# Patient Record
Sex: Male | Born: 1960 | Race: White | Hispanic: No | Marital: Married | Smoking: Current every day smoker
Health system: Southern US, Community
[De-identification: ages and names within clinical notes are randomized; demographics above are authoritative.]

## PROBLEM LIST (undated history)

## (undated) DIAGNOSIS — R0600 Dyspnea, unspecified: Secondary | ICD-10-CM

## (undated) DIAGNOSIS — G4733 Obstructive sleep apnea (adult) (pediatric): Secondary | ICD-10-CM

## (undated) DIAGNOSIS — J449 Chronic obstructive pulmonary disease, unspecified: Secondary | ICD-10-CM

## (undated) HISTORY — DX: Chronic obstructive pulmonary disease, unspecified: J44.9

## (undated) HISTORY — PX: CARDIAC CATHETERIZATION: SHX172

## (undated) HISTORY — DX: Obstructive sleep apnea (adult) (pediatric): G47.33

---

## 2009-09-15 ENCOUNTER — Ambulatory Visit (HOSPITAL_BASED_OUTPATIENT_CLINIC_OR_DEPARTMENT_OTHER): Admission: RE | Admit: 2009-09-15 | Discharge: 2009-09-15 | Payer: Self-pay | Admitting: Orthopedic Surgery

## 2012-01-14 DIAGNOSIS — G2581 Restless legs syndrome: Secondary | ICD-10-CM | POA: Diagnosis not present

## 2012-01-14 DIAGNOSIS — G4710 Hypersomnia, unspecified: Secondary | ICD-10-CM | POA: Diagnosis not present

## 2012-01-14 DIAGNOSIS — J310 Chronic rhinitis: Secondary | ICD-10-CM | POA: Diagnosis not present

## 2012-01-14 DIAGNOSIS — J449 Chronic obstructive pulmonary disease, unspecified: Secondary | ICD-10-CM | POA: Diagnosis not present

## 2012-01-14 DIAGNOSIS — G4730 Sleep apnea, unspecified: Secondary | ICD-10-CM | POA: Diagnosis not present

## 2012-07-18 DIAGNOSIS — Z23 Encounter for immunization: Secondary | ICD-10-CM | POA: Diagnosis not present

## 2012-10-15 DIAGNOSIS — J449 Chronic obstructive pulmonary disease, unspecified: Secondary | ICD-10-CM | POA: Diagnosis not present

## 2012-10-15 DIAGNOSIS — G4710 Hypersomnia, unspecified: Secondary | ICD-10-CM | POA: Diagnosis not present

## 2012-10-15 DIAGNOSIS — G2581 Restless legs syndrome: Secondary | ICD-10-CM | POA: Diagnosis not present

## 2012-10-15 DIAGNOSIS — J310 Chronic rhinitis: Secondary | ICD-10-CM | POA: Diagnosis not present

## 2012-10-15 DIAGNOSIS — G4730 Sleep apnea, unspecified: Secondary | ICD-10-CM | POA: Diagnosis not present

## 2012-10-30 DIAGNOSIS — R0602 Shortness of breath: Secondary | ICD-10-CM | POA: Diagnosis not present

## 2012-10-30 DIAGNOSIS — I10 Essential (primary) hypertension: Secondary | ICD-10-CM | POA: Diagnosis not present

## 2012-10-30 DIAGNOSIS — F17200 Nicotine dependence, unspecified, uncomplicated: Secondary | ICD-10-CM | POA: Diagnosis not present

## 2012-10-30 DIAGNOSIS — J441 Chronic obstructive pulmonary disease with (acute) exacerbation: Secondary | ICD-10-CM | POA: Diagnosis not present

## 2013-06-15 DIAGNOSIS — Z23 Encounter for immunization: Secondary | ICD-10-CM | POA: Diagnosis not present

## 2013-08-27 DIAGNOSIS — F17200 Nicotine dependence, unspecified, uncomplicated: Secondary | ICD-10-CM | POA: Diagnosis not present

## 2013-08-27 DIAGNOSIS — Z79899 Other long term (current) drug therapy: Secondary | ICD-10-CM | POA: Diagnosis not present

## 2013-08-27 DIAGNOSIS — J441 Chronic obstructive pulmonary disease with (acute) exacerbation: Secondary | ICD-10-CM | POA: Diagnosis not present

## 2013-08-27 DIAGNOSIS — E785 Hyperlipidemia, unspecified: Secondary | ICD-10-CM | POA: Diagnosis not present

## 2013-08-27 DIAGNOSIS — E669 Obesity, unspecified: Secondary | ICD-10-CM | POA: Diagnosis not present

## 2013-08-27 DIAGNOSIS — R0789 Other chest pain: Secondary | ICD-10-CM | POA: Diagnosis not present

## 2013-08-27 DIAGNOSIS — R5381 Other malaise: Secondary | ICD-10-CM | POA: Diagnosis not present

## 2013-08-27 DIAGNOSIS — J180 Bronchopneumonia, unspecified organism: Secondary | ICD-10-CM | POA: Diagnosis not present

## 2013-08-27 DIAGNOSIS — R079 Chest pain, unspecified: Secondary | ICD-10-CM | POA: Diagnosis not present

## 2013-08-27 DIAGNOSIS — I10 Essential (primary) hypertension: Secondary | ICD-10-CM | POA: Diagnosis not present

## 2013-08-27 DIAGNOSIS — I498 Other specified cardiac arrhythmias: Secondary | ICD-10-CM | POA: Diagnosis not present

## 2013-08-27 DIAGNOSIS — J449 Chronic obstructive pulmonary disease, unspecified: Secondary | ICD-10-CM | POA: Diagnosis not present

## 2013-08-27 DIAGNOSIS — J440 Chronic obstructive pulmonary disease with acute lower respiratory infection: Secondary | ICD-10-CM | POA: Diagnosis not present

## 2013-08-27 DIAGNOSIS — R0602 Shortness of breath: Secondary | ICD-10-CM | POA: Diagnosis not present

## 2013-09-17 DIAGNOSIS — J440 Chronic obstructive pulmonary disease with acute lower respiratory infection: Secondary | ICD-10-CM | POA: Diagnosis not present

## 2013-09-17 DIAGNOSIS — I10 Essential (primary) hypertension: Secondary | ICD-10-CM | POA: Diagnosis not present

## 2013-10-15 DIAGNOSIS — J441 Chronic obstructive pulmonary disease with (acute) exacerbation: Secondary | ICD-10-CM | POA: Diagnosis not present

## 2013-10-21 ENCOUNTER — Institutional Professional Consult (permissible substitution): Payer: Self-pay | Admitting: Internal Medicine

## 2013-11-10 ENCOUNTER — Ambulatory Visit (INDEPENDENT_AMBULATORY_CARE_PROVIDER_SITE_OTHER)
Admission: RE | Admit: 2013-11-10 | Discharge: 2013-11-10 | Disposition: A | Discharge status: Home / Self Care | Payer: Medicare Other | Source: Ambulatory Visit | Attending: Internal Medicine | Admitting: Internal Medicine

## 2013-11-10 ENCOUNTER — Encounter: Payer: Self-pay | Admitting: Internal Medicine

## 2013-11-10 ENCOUNTER — Ambulatory Visit (INDEPENDENT_AMBULATORY_CARE_PROVIDER_SITE_OTHER): Payer: Medicare Other | Admitting: Internal Medicine

## 2013-11-10 VITALS — BP 140/80 | HR 104 | Temp 97.9°F | Ht 68.0 in | Wt 281.0 lb

## 2013-11-10 DIAGNOSIS — J4489 Other specified chronic obstructive pulmonary disease: Secondary | ICD-10-CM

## 2013-11-10 DIAGNOSIS — F17200 Nicotine dependence, unspecified, uncomplicated: Secondary | ICD-10-CM

## 2013-11-10 DIAGNOSIS — J449 Chronic obstructive pulmonary disease, unspecified: Secondary | ICD-10-CM

## 2013-11-10 MED ORDER — PREDNISONE 10 MG PO TABS: ORAL_TABLET | ORAL | Status: DC

## 2013-11-10 MED ORDER — FAMOTIDINE 20 MG PO TABS: ORAL_TABLET | ORAL | Status: DC

## 2013-11-10 MED ORDER — PANTOPRAZOLE SODIUM 40 MG PO TBEC: 40.0000 mg | DELAYED_RELEASE_TABLET | Freq: Every day | ORAL | Status: DC

## 2013-11-10 MED ORDER — ALBUTEROL SULFATE HFA 108 (90 BASE) MCG/ACT IN AERS: 2.0000 | INHALATION_SPRAY | RESPIRATORY_TRACT | Status: AC | PRN

## 2013-11-10 NOTE — Assessment & Plan Note (Signed)
-   11/10/2013   Walked RA x 2 laps @ 185 stopped due to sob no desat  DDX of  difficult airways managment all start with A and  include Adherence, Ace Inhibitors, Acid Reflux, Active Sinus Disease, Alpha 1 Antitripsin deficiency, Anxiety masquerading as Airways dz,  ABPA,  allergy(esp in young), Aspiration (esp in elderly), Adverse effects of DPI,  Active smokers, plus two Bs  = Bronchiectasis and Beta blocker use..and one C= CHF  Active smoking is greatest concern > see smoking a/p  ? Acid (or non-acid) GERD > always difficult to exclude as up to 75% of pts in some series report no assoc GI/ Heartburn symptoms> rec max (24h)  acid suppression and diet restrictions/ reviewed and instructions given in writing.   ? Anxiety/ obesity/ deconditioning all playing a role as well > note he walked a lot more than "across the room" for us today     Each maintenance medication was reviewed in detail including most importantly the difference between maintenance and as needed and under what circumstances the prns are to be used.  Please see instructions for details which were reviewed in writing and the patient given a copy.

## 2013-11-10 NOTE — Patient Instructions (Addendum)
Prednisone 10 mg take  4 each am x 2 days,   2 each am x 2 days,  1 each am x 2 days and stop   Pantoprazole (protonix) 40 mg   Take 30-60 min before first meal of the day and Pepcid 20 mg one bedtime until return to office - this is the best way to tell whether stomach acid is contributing to your problem.    GERD (REFLUX)  is an extremely common cause of respiratory symptoms, many times with no significant heartburn at all.    It can be treated with medication, but also with lifestyle changes including avoidance of late meals, excessive alcohol, smoking cessation, and avoid fatty foods, chocolate, peppermint, colas, red wine, and acidic juices such as orange juice.  NO MINT OR MENTHOL PRODUCTS SO NO COUGH DROPS  USE SUGARLESS CANDY INSTEAD (jolley ranchers or Stover's)  NO OIL BASED VITAMINS - use powdered substitutes.  Only use your albuterol(proair) as a rescue medication to be used if you can't catch your breath by resting or doing a relaxed purse lip breathing pattern.  - The less you use it, the better it will work when you need it. - Ok to use up to 2 puffs  every 4 hours if you must but call for immediate appointment if use goes up over your usual need - Don't leave home without it !!  (think of it like the spare tire for your car)   Please remember to go to the x-ray department downstairs for your tests - we will call you with the results when they are available.     The key is to stop smoking completely before smoking completely stops you- this is the most important aspect of your care   Please schedule a follow up office visit in 4 weeks, sooner if needed

## 2013-11-10 NOTE — Assessment & Plan Note (Signed)

## 2013-11-10 NOTE — Progress Notes (Signed)
Quick Note:  LMTCB ______ 

## 2013-11-10 NOTE — Progress Notes (Signed)
Subjective:    Patient ID: Henry LoseJohn R Watts, male    DOB: 07/12/1961    MRN: [id number]  HPI  89 yowm active smoking dx 2003 dx COPD HP then follow in [place] by Chodri since then  referred 11/10/2013 by Dr Marina GoodellPerry to pulmonary clinic for eval of progressive doe   11/10/2013 1st [facility] office visit/ Henry Watts cc progressive x 10 y doe now x room to room with 50 lb wt gain since dx of copd 10 y prior to OV  some better on duoneb/budesonide. Assoc with min prod (white mucus)  cough worse at hs and typically sleeping at 45 degrees - wearing 02 just at hs 2.5lpm  and can't use cpap due to smothering  Also cp x 3 years, present daily x hours at a time, no relation to activity or meals, neg cardiac w/u x 3 including cath x 2, midline to bilateral parasternal not pleuritic but ? Some better p neb rx, no better with rx for gerd to date  No obvious patterns in day to day or daytime variabilty or assoc  subjective wheeze overt sinus  symptoms. No unusual exp hx or h/o childhood pna/ asthma or knowledge of premature birth.    Also denies any obvious fluctuation of symptoms with weather or environmental changes or other aggravating or alleviating factors except as outlined above   Current Medications, Allergies, Complete Past Medical History, Past Surgical History, Family History, and Social History were reviewed in [facility] electronic medical record.  ROS  The following are not active complaints unless bolded sore throat, dysphagia, dental problems, itching, sneezing,  nasal congestion or excess/ purulent secretions, ear ache,   fever, chills, sweats, unintended wt loss, pleuritic or exertional cp, hemoptysis,  orthopnea pnd or leg swelling, presyncope, palpitations, heartburn, abdominal pain, anorexia, nausea, vomiting, diarrhea  or change in bowel or urinary habits, change in stools or urine, dysuria,hematuria,  rash, arthralgias, visual complaints, headache, numbness weakness or ataxia or problems  with walking or coordination,  change in mood/affect or memory.         Review of Systems  Constitutional: Negative for fever, chills, activity change, appetite change and unexpected weight change.  HENT: Positive for congestion and trouble swallowing. Negative for dental problem, postnasal drip, rhinorrhea, sneezing, sore throat and voice change.   Eyes: Negative for visual disturbance.  Respiratory: Positive for cough and shortness of breath. Negative for choking.   Cardiovascular: Positive for chest pain. Negative for leg swelling.  Gastrointestinal: Negative for nausea, vomiting and abdominal pain.  Genitourinary: Negative for difficulty urinating.       Heartburn Indigestion  Musculoskeletal: Positive for arthralgias.  Skin: Negative for rash.  Psychiatric/Behavioral: Negative for behavioral problems and confusion.       Objective:   Physical Exam Wt Readings from Last 3 Encounters:  11/10/13 281 lb (127.461 kg)      HEENT mild turbinate edema.  Oropharynx no thrush or excess pnd or cobblestoning.  No JVD or cervical adenopathy. Mild accessory muscle hypertrophy. Trachea midline, nl thryroid. Chest was hyperinflated by percussion with diminished breath sounds and moderate increased exp time without wheeze. Hoover sign positive at mid inspiration. Regular rate and rhythm without murmur gallop or rub or increase P2 or edema.  Abd: no hsm, nl excursion. Ext warm without cyanosis or clubbing.   CXR  11/10/2013 :   1. No acute cardiopulmonary disease. 2. COPD and changes from healed granulomatous disease. Mild lung base scarring.  Assessment & Plan:

## 2013-11-11 ENCOUNTER — Telehealth: Payer: Self-pay | Admitting: Internal Medicine

## 2013-11-11 NOTE — Telephone Encounter (Signed)
Result Note     Call pt: Reviewed cxr and no acute change so no change in recommendations made at ov  ---  I spoke with patient about results and he verbalized understanding and had no questions 

## 2013-12-02 DIAGNOSIS — J441 Chronic obstructive pulmonary disease with (acute) exacerbation: Secondary | ICD-10-CM | POA: Diagnosis not present

## 2013-12-09 DIAGNOSIS — J441 Chronic obstructive pulmonary disease with (acute) exacerbation: Secondary | ICD-10-CM | POA: Diagnosis not present

## 2013-12-15 ENCOUNTER — Encounter: Payer: Self-pay | Admitting: Internal Medicine

## 2013-12-15 ENCOUNTER — Ambulatory Visit (INDEPENDENT_AMBULATORY_CARE_PROVIDER_SITE_OTHER): Payer: Medicare Other | Admitting: Internal Medicine

## 2013-12-15 VITALS — BP 130/84 | HR 110 | Temp 97.9°F | Ht 68.0 in | Wt 280.0 lb

## 2013-12-15 DIAGNOSIS — J449 Chronic obstructive pulmonary disease, unspecified: Secondary | ICD-10-CM | POA: Diagnosis not present

## 2013-12-15 DIAGNOSIS — F17200 Nicotine dependence, unspecified, uncomplicated: Secondary | ICD-10-CM

## 2013-12-15 DIAGNOSIS — J4489 Other specified chronic obstructive pulmonary disease: Secondary | ICD-10-CM

## 2013-12-15 LAB — PULMONARY FUNCTION TEST
DL/VA % pred: 90 %
DL/VA: 4.08 ml/min/mmHg/L
DLCO UNC % PRED: 78 %
DLCO UNC: 23.21 ml/min/mmHg
FEF 25-75 POST: 0.61 L/s
FEF 25-75 Pre: 0.51 L/sec
FEF2575-%Change-Post: 20 %
FEF2575-%Pred-Post: 19 %
FEF2575-%Pred-Pre: 15 %
FEV1-%Change-Post: 5 %
FEV1-%Pred-Post: 33 %
FEV1-%Pred-Pre: 32 %
FEV1-Post: 1.22 L
FEV1-Pre: 1.16 L
FEV1FVC-%Change-Post: 0 %
FEV1FVC-%Pred-Pre: 55 %
FEV6-%Change-Post: 5 %
FEV6-%Pred-Post: 60 %
FEV6-%Pred-Pre: 57 %
FEV6-POST: 2.73 L
FEV6-PRE: 2.58 L
FEV6FVC-%CHANGE-POST: 0 %
FEV6FVC-%PRED-POST: 100 %
FEV6FVC-%PRED-PRE: 100 %
FVC-%CHANGE-POST: 5 %
FVC-%PRED-POST: 60 %
FVC-%PRED-PRE: 57 %
FVC-Post: 2.84 L
FVC-Pre: 2.69 L
PRE FEV1/FVC RATIO: 43 %
Post FEV1/FVC ratio: 43 %
Post FEV6/FVC ratio: 96 %
Pre FEV6/FVC Ratio: 96 %
RV % pred: 192 %
RV: 3.77 L
TLC % PRED: 104 %
TLC: 6.88 L

## 2013-12-15 MED ORDER — UMECLIDINIUM-VILANTEROL 62.5-25 MCG/INH IN AEPB: 2.0000 | INHALATION_SPRAY | Freq: Once | RESPIRATORY_TRACT | Status: DC

## 2013-12-15 NOTE — Progress Notes (Signed)
PFT done today. 

## 2013-12-15 NOTE — Progress Notes (Signed)
Subjective:    Patient ID: Henry Watts, male    DOB: May 12, 1961    MRN: [id number]  Brief patient profile:  22 yowm active smoking dx 2003 dx COPD HP then follow in [place] by Chodri since then  referred 11/10/2013 by Dr Marina Goodell to pulmonary clinic for eval of progressive doe    History of Present Illness  11/10/2013 1st [facility] office visit/ Wert cc progressive x 10 y doe now x room to room with 50 lb wt gain since dx of copd 10 y prior to OV  some better on duoneb/budesonide. Assoc with min prod (white mucus)  cough worse at hs and typically sleeping at 45 degrees - wearing 02 just at hs 2.5lpm  and can't use cpap due to smothering Also cp x 3 years, present daily x hours at a time, no relation to activity or meals, neg cardiac w/u x 3 including cath x 2, midline to bilateral parasternal not pleuritic but ? Some better p neb rx, no better with rx for gerd to date rec Prednisone 10 mg take  4 each am x 2 days,   2 each am x 2 days,  1 each am x 2 days and stop  Pantoprazole (protonix) 40 mg   Take 30-60 min before first meal of the day and Pepcid 20 mg one bedtime until return to office - this is the best way to tell whether stomach acid is contributing to your problem.   GERD diet   12/15/2013 f/u ov/Wert re: GOLD III COPD/ still smoking/ no change p above rx Chief Complaint  Patient presents with  . Followup with PFT's    Pt states that his symptoms are unchagned since the last visit. Still smoking. Using proair approx 5 times daily and duoneb 8 x per day.    Congested cough in am with thick mucus   No obvious patterns in day to day or daytime variabilty or assoc  subjective wheeze overt sinus  symptoms. No unusual exp hx or h/o childhood pna/ asthma or knowledge of premature birth.    Also denies any obvious fluctuation of symptoms with weather or environmental changes or other aggravating or alleviating factors except as outlined above   Current Medications, Allergies,  Complete Past Medical History, Past Surgical History, Family History, and Social History were reviewed in [facility] record.  ROS  The following are not active complaints unless bolded sore throat, dysphagia, dental problems, itching, sneezing,  nasal congestion or excess/ purulent secretions, ear ache,   fever, chills, sweats, unintended wt loss, pleuritic or exertional cp, hemoptysis,  orthopnea pnd or leg swelling, presyncope, palpitations, heartburn, abdominal pain, anorexia, nausea, vomiting, diarrhea  or change in bowel or urinary habits, change in stools or urine, dysuria,hematuria,  rash, arthralgias, visual complaints, headache, numbness weakness or ataxia or problems with walking or coordination,  change in mood/affect or memory.               Objective:   Physical Exam  Wt Readings from Last 3 Encounters:  12/15/13 280 lb (127.007 kg)  11/10/13 281 lb (127.461 kg)         HEENT mild turbinate edema.  Oropharynx no thrush or excess pnd or cobblestoning.  No JVD or cervical adenopathy. Mild accessory muscle hypertrophy. Trachea midline, nl thryroid. Chest was hyperinflated by percussion with diminished breath sounds and moderate increased exp time without wheeze. Hoover sign positive at mid inspiration. Regular rate and rhythm without murmur  gallop or rub or increase P2 or edema.  Abd: no hsm, nl excursion. Ext warm without cyanosis or clubbing.   CXR  11/10/2013 :   1. No acute cardiopulmonary disease. 2. COPD and changes from healed granulomatous disease. Mild lung base scarring.       Assessment & Plan:

## 2013-12-15 NOTE — Patient Instructions (Signed)
Try anoro 2puffs/ one click each am  The key is to stop smoking completely before smoking completely stops you!   Only use your albuterol (proair) as a rescue medication to be used if you can't catch your breath by resting or doing a relaxed purse lip breathing pattern.  - The less you use it, the better it will work when you need it. - Ok to use up to 2 puffs  every 4 hours if you must but call for immediate appointment if use goes up over your usual need - Don't leave home without it !!  (think of it like the spare tire for your car)   If can't catch your breath at rest, ok to use duoneb if you have to  Please schedule a follow up office visit in 4 weeks, sooner if needed

## 2013-12-17 NOTE — Progress Notes (Signed)
Quick Note:  LMTCB ______ 

## 2013-12-17 NOTE — Progress Notes (Signed)
Quick Note:  Spoke with pt's and notified of results per Dr. Sherene SiresWert. She verbalized understanding and denied any questions. Will inform the pt  ______

## 2013-12-19 NOTE — Assessment & Plan Note (Signed)
>   3 mi  I took an extended  opportunity with this patient to outline the consequences of continued cigarette use  in airway disorders based on all the data we have from the multiple national lung health studies (perfomed over decades at millions of dollars in cost)  indicating that smoking cessation, not choice of inhalers or physicians, is the most important aspect of care.   

## 2013-12-19 NOTE — Assessment & Plan Note (Signed)
-   [facility]  - 11/10/2013   Walked RA x 2 laps  @ 185 stopped due to sob no desat - PFT's 12/15/2013  FEV1 1.16 (32%) 43 and no better p B2 and DLCO 78%   DDX of  difficult airways managment all start with A and  include Adherence, Ace Inhibitors, Acid Reflux, Active Sinus Disease, Alpha 1 Antitripsin deficiency, Anxiety masquerading as Airways dz,  ABPA,  allergy(esp in young), Aspiration (esp in elderly), Adverse effects of DPI,  Active smokers, plus two Bs  = Bronchiectasis and Beta blocker use..and one C= CHF  Adherence is always the initial "prime suspect" and is a multilayered concern that requires a "trust but verify" approach in every patient - starting with knowing how to use medications, especially inhalers, correctly, keeping up with refills and understanding the fundamental difference between maintenance and prns vs those medications only taken for a very short course and then stopped and not refilled.  - The proper method of use, as well as anticipated side effects, of a metered-dose inhaler are discussed and demonstrated to the patient. Improved effectiveness after extensive coaching during this visit to a level of approximately  75% at best and way over using saba/sama  rec trial of anoro one puff each am as a new "plan A" = automatic  Active smoking discussed separately  ? Anxiety > dx of exclusion

## 2014-01-01 ENCOUNTER — Encounter: Payer: Self-pay | Admitting: Internal Medicine

## 2014-01-05 DIAGNOSIS — E7800 Pure hypercholesterolemia, unspecified: Secondary | ICD-10-CM | POA: Diagnosis not present

## 2014-01-05 DIAGNOSIS — R7309 Other abnormal glucose: Secondary | ICD-10-CM | POA: Diagnosis not present

## 2014-01-05 DIAGNOSIS — E1142 Type 2 diabetes mellitus with diabetic polyneuropathy: Secondary | ICD-10-CM | POA: Diagnosis not present

## 2014-01-05 DIAGNOSIS — I10 Essential (primary) hypertension: Secondary | ICD-10-CM | POA: Diagnosis not present

## 2014-01-05 DIAGNOSIS — J4489 Other specified chronic obstructive pulmonary disease: Secondary | ICD-10-CM | POA: Diagnosis not present

## 2014-01-05 DIAGNOSIS — G610 Guillain-Barre syndrome: Secondary | ICD-10-CM | POA: Diagnosis not present

## 2014-01-05 DIAGNOSIS — J449 Chronic obstructive pulmonary disease, unspecified: Secondary | ICD-10-CM | POA: Diagnosis not present

## 2014-01-12 ENCOUNTER — Ambulatory Visit: Payer: Medicare Other | Admitting: Internal Medicine

## 2014-01-13 DIAGNOSIS — IMO0001 Reserved for inherently not codable concepts without codable children: Secondary | ICD-10-CM | POA: Diagnosis not present

## 2014-01-13 DIAGNOSIS — E785 Hyperlipidemia, unspecified: Secondary | ICD-10-CM | POA: Diagnosis not present

## 2014-01-18 ENCOUNTER — Telehealth: Payer: Self-pay | Admitting: Internal Medicine

## 2014-01-18 MED ORDER — UMECLIDINIUM-VILANTEROL 62.5-25 MCG/INH IN AEPB: 2.0000 | INHALATION_SPRAY | Freq: Once | RESPIRATORY_TRACT | Status: DC

## 2014-01-18 NOTE — Telephone Encounter (Signed)
lmomtcb x1 

## 2014-01-18 NOTE — Telephone Encounter (Signed)
Souse aware samples left for pick up. Nothing further needed

## 2014-01-18 NOTE — Telephone Encounter (Signed)
Pt's wife returned call  

## 2014-01-26 ENCOUNTER — Ambulatory Visit: Payer: Medicare Other | Admitting: Internal Medicine

## 2014-02-08 ENCOUNTER — Telehealth: Payer: Self-pay | Admitting: Internal Medicine

## 2014-02-08 MED ORDER — UMECLIDINIUM-VILANTEROL 62.5-25 MCG/INH IN AEPB: 2.0000 | INHALATION_SPRAY | Freq: Once | RESPIRATORY_TRACT | Status: DC

## 2014-02-08 NOTE — Telephone Encounter (Signed)
Called spoke with spouse. Aware will send in RX nothing further needed

## 2014-02-09 ENCOUNTER — Telehealth: Payer: Self-pay | Admitting: Internal Medicine

## 2014-02-09 NOTE — Telephone Encounter (Signed)
PA is needed on Anoro.  Called [employer] [facility] and spoke with Kevin FentonJerome. PA was iniated and completed. Anoro has been approved through 10/07/2014. An authorization was not given. Per Rochel BromeJerome, [employer] doesn't issue authorization number.  Pt's wife is aware that medication has been approved.

## 2014-02-11 ENCOUNTER — Telehealth: Payer: Self-pay | Admitting: Internal Medicine

## 2014-02-11 MED ORDER — UMECLIDINIUM-VILANTEROL 62.5-25 MCG/INH IN AEPB: 2.0000 | INHALATION_SPRAY | Freq: Once | RESPIRATORY_TRACT | Status: AC

## 2014-02-11 NOTE — Telephone Encounter (Signed)
I called and spoke with spouse. She is aware anoro is not generic. 1 sample left for pick up. Nothing further needed

## 2014-03-17 ENCOUNTER — Other Ambulatory Visit: Payer: Self-pay | Admitting: Internal Medicine

## 2014-04-12 DIAGNOSIS — E1142 Type 2 diabetes mellitus with diabetic polyneuropathy: Secondary | ICD-10-CM | POA: Diagnosis not present

## 2014-04-12 DIAGNOSIS — E785 Hyperlipidemia, unspecified: Secondary | ICD-10-CM | POA: Diagnosis not present

## 2014-04-12 DIAGNOSIS — Z6839 Body mass index (BMI) 39.0-39.9, adult: Secondary | ICD-10-CM | POA: Diagnosis not present

## 2014-04-12 DIAGNOSIS — N390 Urinary tract infection, site not specified: Secondary | ICD-10-CM | POA: Diagnosis not present

## 2014-04-12 DIAGNOSIS — E1149 Type 2 diabetes mellitus with other diabetic neurological complication: Secondary | ICD-10-CM | POA: Diagnosis not present

## 2014-04-12 DIAGNOSIS — J449 Chronic obstructive pulmonary disease, unspecified: Secondary | ICD-10-CM | POA: Diagnosis not present

## 2014-04-13 DIAGNOSIS — E119 Type 2 diabetes mellitus without complications: Secondary | ICD-10-CM | POA: Diagnosis not present

## 2014-04-13 DIAGNOSIS — J449 Chronic obstructive pulmonary disease, unspecified: Secondary | ICD-10-CM | POA: Diagnosis not present

## 2014-04-13 DIAGNOSIS — E1142 Type 2 diabetes mellitus with diabetic polyneuropathy: Secondary | ICD-10-CM | POA: Diagnosis not present

## 2014-04-13 DIAGNOSIS — E785 Hyperlipidemia, unspecified: Secondary | ICD-10-CM | POA: Diagnosis not present

## 2014-05-05 ENCOUNTER — Other Ambulatory Visit: Payer: Self-pay | Admitting: Internal Medicine

## 2014-05-12 DIAGNOSIS — F3289 Other specified depressive episodes: Secondary | ICD-10-CM | POA: Diagnosis not present

## 2014-05-12 DIAGNOSIS — F329 Major depressive disorder, single episode, unspecified: Secondary | ICD-10-CM | POA: Diagnosis not present

## 2014-05-12 DIAGNOSIS — Z6838 Body mass index (BMI) 38.0-38.9, adult: Secondary | ICD-10-CM | POA: Diagnosis not present

## 2014-06-11 DIAGNOSIS — E11319 Type 2 diabetes mellitus with unspecified diabetic retinopathy without macular edema: Secondary | ICD-10-CM | POA: Diagnosis not present

## 2014-06-11 DIAGNOSIS — F3289 Other specified depressive episodes: Secondary | ICD-10-CM | POA: Diagnosis not present

## 2014-06-11 DIAGNOSIS — F329 Major depressive disorder, single episode, unspecified: Secondary | ICD-10-CM | POA: Diagnosis not present

## 2014-06-23 DIAGNOSIS — Z23 Encounter for immunization: Secondary | ICD-10-CM | POA: Diagnosis not present

## 2014-07-12 DIAGNOSIS — E1140 Type 2 diabetes mellitus with diabetic neuropathy, unspecified: Secondary | ICD-10-CM | POA: Diagnosis not present

## 2014-07-12 DIAGNOSIS — I10 Essential (primary) hypertension: Secondary | ICD-10-CM | POA: Diagnosis not present

## 2014-07-12 DIAGNOSIS — E1142 Type 2 diabetes mellitus with diabetic polyneuropathy: Secondary | ICD-10-CM | POA: Diagnosis not present

## 2014-07-12 DIAGNOSIS — Z6839 Body mass index (BMI) 39.0-39.9, adult: Secondary | ICD-10-CM | POA: Diagnosis not present

## 2014-07-12 DIAGNOSIS — G8929 Other chronic pain: Secondary | ICD-10-CM | POA: Diagnosis not present

## 2014-07-12 DIAGNOSIS — J449 Chronic obstructive pulmonary disease, unspecified: Secondary | ICD-10-CM | POA: Diagnosis not present

## 2014-08-11 DIAGNOSIS — R0902 Hypoxemia: Secondary | ICD-10-CM | POA: Diagnosis not present

## 2014-08-11 DIAGNOSIS — Z6838 Body mass index (BMI) 38.0-38.9, adult: Secondary | ICD-10-CM | POA: Diagnosis not present

## 2014-08-11 DIAGNOSIS — J441 Chronic obstructive pulmonary disease with (acute) exacerbation: Secondary | ICD-10-CM | POA: Diagnosis not present

## 2014-09-12 DIAGNOSIS — J9811 Atelectasis: Secondary | ICD-10-CM | POA: Diagnosis not present

## 2014-09-12 DIAGNOSIS — E119 Type 2 diabetes mellitus without complications: Secondary | ICD-10-CM | POA: Diagnosis not present

## 2014-09-12 DIAGNOSIS — R069 Unspecified abnormalities of breathing: Secondary | ICD-10-CM | POA: Diagnosis not present

## 2014-09-12 DIAGNOSIS — J441 Chronic obstructive pulmonary disease with (acute) exacerbation: Secondary | ICD-10-CM | POA: Diagnosis not present

## 2014-09-12 DIAGNOSIS — A419 Sepsis, unspecified organism: Secondary | ICD-10-CM | POA: Diagnosis not present

## 2014-09-12 DIAGNOSIS — F17210 Nicotine dependence, cigarettes, uncomplicated: Secondary | ICD-10-CM | POA: Diagnosis not present

## 2014-09-12 DIAGNOSIS — I10 Essential (primary) hypertension: Secondary | ICD-10-CM | POA: Diagnosis not present

## 2014-09-14 DIAGNOSIS — A084 Viral intestinal infection, unspecified: Secondary | ICD-10-CM | POA: Diagnosis not present

## 2014-09-14 DIAGNOSIS — J441 Chronic obstructive pulmonary disease with (acute) exacerbation: Secondary | ICD-10-CM | POA: Diagnosis not present

## 2014-09-14 DIAGNOSIS — Z6839 Body mass index (BMI) 39.0-39.9, adult: Secondary | ICD-10-CM | POA: Diagnosis not present

## 2014-10-12 DIAGNOSIS — Z6839 Body mass index (BMI) 39.0-39.9, adult: Secondary | ICD-10-CM | POA: Diagnosis not present

## 2014-10-12 DIAGNOSIS — I10 Essential (primary) hypertension: Secondary | ICD-10-CM | POA: Diagnosis not present

## 2014-10-12 DIAGNOSIS — E785 Hyperlipidemia, unspecified: Secondary | ICD-10-CM | POA: Diagnosis not present

## 2014-10-12 DIAGNOSIS — E1142 Type 2 diabetes mellitus with diabetic polyneuropathy: Secondary | ICD-10-CM | POA: Diagnosis not present

## 2014-10-12 DIAGNOSIS — E1140 Type 2 diabetes mellitus with diabetic neuropathy, unspecified: Secondary | ICD-10-CM | POA: Diagnosis not present

## 2014-10-12 DIAGNOSIS — M47816 Spondylosis without myelopathy or radiculopathy, lumbar region: Secondary | ICD-10-CM | POA: Diagnosis not present

## 2014-11-15 DIAGNOSIS — J441 Chronic obstructive pulmonary disease with (acute) exacerbation: Secondary | ICD-10-CM | POA: Diagnosis not present

## 2014-11-15 DIAGNOSIS — Z6838 Body mass index (BMI) 38.0-38.9, adult: Secondary | ICD-10-CM | POA: Diagnosis not present

## 2014-12-27 DIAGNOSIS — Z6839 Body mass index (BMI) 39.0-39.9, adult: Secondary | ICD-10-CM | POA: Diagnosis not present

## 2014-12-27 DIAGNOSIS — J309 Allergic rhinitis, unspecified: Secondary | ICD-10-CM | POA: Diagnosis not present

## 2014-12-27 DIAGNOSIS — J45909 Unspecified asthma, uncomplicated: Secondary | ICD-10-CM | POA: Diagnosis not present

## 2015-01-13 DIAGNOSIS — E785 Hyperlipidemia, unspecified: Secondary | ICD-10-CM | POA: Diagnosis not present

## 2015-01-13 DIAGNOSIS — E119 Type 2 diabetes mellitus without complications: Secondary | ICD-10-CM | POA: Diagnosis not present

## 2015-01-13 DIAGNOSIS — Z0000 Encounter for general adult medical examination without abnormal findings: Secondary | ICD-10-CM | POA: Diagnosis not present

## 2015-01-13 DIAGNOSIS — I10 Essential (primary) hypertension: Secondary | ICD-10-CM | POA: Diagnosis not present

## 2015-01-13 DIAGNOSIS — Z6839 Body mass index (BMI) 39.0-39.9, adult: Secondary | ICD-10-CM | POA: Diagnosis not present

## 2015-01-27 DIAGNOSIS — K429 Umbilical hernia without obstruction or gangrene: Secondary | ICD-10-CM | POA: Diagnosis not present

## 2015-02-03 DIAGNOSIS — G4733 Obstructive sleep apnea (adult) (pediatric): Secondary | ICD-10-CM | POA: Diagnosis not present

## 2015-02-03 DIAGNOSIS — J449 Chronic obstructive pulmonary disease, unspecified: Secondary | ICD-10-CM | POA: Diagnosis not present

## 2015-02-03 DIAGNOSIS — J301 Allergic rhinitis due to pollen: Secondary | ICD-10-CM | POA: Diagnosis not present

## 2015-02-03 DIAGNOSIS — F17210 Nicotine dependence, cigarettes, uncomplicated: Secondary | ICD-10-CM | POA: Diagnosis not present

## 2015-02-24 DIAGNOSIS — K429 Umbilical hernia without obstruction or gangrene: Secondary | ICD-10-CM | POA: Diagnosis not present

## 2015-02-25 DIAGNOSIS — J439 Emphysema, unspecified: Secondary | ICD-10-CM | POA: Diagnosis not present

## 2015-02-25 DIAGNOSIS — Z01818 Encounter for other preprocedural examination: Secondary | ICD-10-CM | POA: Diagnosis not present

## 2015-02-25 DIAGNOSIS — J449 Chronic obstructive pulmonary disease, unspecified: Secondary | ICD-10-CM | POA: Diagnosis not present

## 2015-02-25 DIAGNOSIS — I10 Essential (primary) hypertension: Secondary | ICD-10-CM | POA: Diagnosis not present

## 2015-02-28 DIAGNOSIS — G4733 Obstructive sleep apnea (adult) (pediatric): Secondary | ICD-10-CM | POA: Diagnosis not present

## 2015-02-28 DIAGNOSIS — J449 Chronic obstructive pulmonary disease, unspecified: Secondary | ICD-10-CM | POA: Diagnosis not present

## 2015-02-28 DIAGNOSIS — J439 Emphysema, unspecified: Secondary | ICD-10-CM | POA: Diagnosis not present

## 2015-02-28 DIAGNOSIS — J441 Chronic obstructive pulmonary disease with (acute) exacerbation: Secondary | ICD-10-CM | POA: Diagnosis not present

## 2015-02-28 DIAGNOSIS — J180 Bronchopneumonia, unspecified organism: Secondary | ICD-10-CM | POA: Diagnosis not present

## 2015-02-28 DIAGNOSIS — J45909 Unspecified asthma, uncomplicated: Secondary | ICD-10-CM | POA: Diagnosis not present

## 2015-02-28 DIAGNOSIS — J9610 Chronic respiratory failure, unspecified whether with hypoxia or hypercapnia: Secondary | ICD-10-CM | POA: Diagnosis not present

## 2015-02-28 DIAGNOSIS — Z9981 Dependence on supplemental oxygen: Secondary | ICD-10-CM | POA: Diagnosis not present

## 2015-02-28 DIAGNOSIS — F17200 Nicotine dependence, unspecified, uncomplicated: Secondary | ICD-10-CM | POA: Diagnosis not present

## 2015-02-28 DIAGNOSIS — I10 Essential (primary) hypertension: Secondary | ICD-10-CM | POA: Diagnosis not present

## 2015-02-28 DIAGNOSIS — E119 Type 2 diabetes mellitus without complications: Secondary | ICD-10-CM | POA: Diagnosis not present

## 2015-02-28 DIAGNOSIS — G8918 Other acute postprocedural pain: Secondary | ICD-10-CM | POA: Diagnosis not present

## 2015-02-28 DIAGNOSIS — K429 Umbilical hernia without obstruction or gangrene: Secondary | ICD-10-CM | POA: Diagnosis not present

## 2015-02-28 DIAGNOSIS — R079 Chest pain, unspecified: Secondary | ICD-10-CM | POA: Diagnosis not present

## 2015-03-01 DIAGNOSIS — J441 Chronic obstructive pulmonary disease with (acute) exacerbation: Secondary | ICD-10-CM | POA: Diagnosis not present

## 2015-03-01 DIAGNOSIS — Z9981 Dependence on supplemental oxygen: Secondary | ICD-10-CM | POA: Diagnosis not present

## 2015-03-01 DIAGNOSIS — K429 Umbilical hernia without obstruction or gangrene: Secondary | ICD-10-CM | POA: Diagnosis not present

## 2015-03-01 DIAGNOSIS — R079 Chest pain, unspecified: Secondary | ICD-10-CM | POA: Diagnosis not present

## 2015-03-01 DIAGNOSIS — J180 Bronchopneumonia, unspecified organism: Secondary | ICD-10-CM | POA: Diagnosis not present

## 2015-03-01 DIAGNOSIS — J9610 Chronic respiratory failure, unspecified whether with hypoxia or hypercapnia: Secondary | ICD-10-CM | POA: Diagnosis not present

## 2015-03-01 DIAGNOSIS — G4733 Obstructive sleep apnea (adult) (pediatric): Secondary | ICD-10-CM | POA: Diagnosis not present

## 2015-03-01 DIAGNOSIS — J449 Chronic obstructive pulmonary disease, unspecified: Secondary | ICD-10-CM | POA: Diagnosis not present

## 2015-03-14 DIAGNOSIS — K91840 Postprocedural hemorrhage and hematoma of a digestive system organ or structure following a digestive system procedure: Secondary | ICD-10-CM | POA: Diagnosis not present

## 2015-03-14 DIAGNOSIS — M96831 Postprocedural hemorrhage and hematoma of a musculoskeletal structure following other procedure: Secondary | ICD-10-CM | POA: Diagnosis not present

## 2015-03-14 DIAGNOSIS — T888XXA Other specified complications of surgical and medical care, not elsewhere classified, initial encounter: Secondary | ICD-10-CM | POA: Diagnosis not present

## 2015-03-14 DIAGNOSIS — J45909 Unspecified asthma, uncomplicated: Secondary | ICD-10-CM | POA: Diagnosis not present

## 2015-03-14 DIAGNOSIS — F17210 Nicotine dependence, cigarettes, uncomplicated: Secondary | ICD-10-CM | POA: Diagnosis not present

## 2015-03-14 DIAGNOSIS — R935 Abnormal findings on diagnostic imaging of other abdominal regions, including retroperitoneum: Secondary | ICD-10-CM | POA: Diagnosis not present

## 2015-03-14 DIAGNOSIS — Z79899 Other long term (current) drug therapy: Secondary | ICD-10-CM | POA: Diagnosis not present

## 2015-03-14 DIAGNOSIS — J439 Emphysema, unspecified: Secondary | ICD-10-CM | POA: Diagnosis not present

## 2015-04-14 DIAGNOSIS — M47816 Spondylosis without myelopathy or radiculopathy, lumbar region: Secondary | ICD-10-CM | POA: Diagnosis not present

## 2015-04-14 DIAGNOSIS — I10 Essential (primary) hypertension: Secondary | ICD-10-CM | POA: Diagnosis not present

## 2015-04-14 DIAGNOSIS — E1142 Type 2 diabetes mellitus with diabetic polyneuropathy: Secondary | ICD-10-CM | POA: Diagnosis not present

## 2015-04-14 DIAGNOSIS — E785 Hyperlipidemia, unspecified: Secondary | ICD-10-CM | POA: Diagnosis not present

## 2015-04-14 DIAGNOSIS — E1140 Type 2 diabetes mellitus with diabetic neuropathy, unspecified: Secondary | ICD-10-CM | POA: Diagnosis not present

## 2015-04-14 DIAGNOSIS — J449 Chronic obstructive pulmonary disease, unspecified: Secondary | ICD-10-CM | POA: Diagnosis not present

## 2015-04-14 DIAGNOSIS — Z6839 Body mass index (BMI) 39.0-39.9, adult: Secondary | ICD-10-CM | POA: Diagnosis not present

## 2015-04-30 DIAGNOSIS — J441 Chronic obstructive pulmonary disease with (acute) exacerbation: Secondary | ICD-10-CM | POA: Diagnosis not present

## 2015-04-30 DIAGNOSIS — Z6839 Body mass index (BMI) 39.0-39.9, adult: Secondary | ICD-10-CM | POA: Diagnosis not present

## 2015-05-10 DIAGNOSIS — R0902 Hypoxemia: Secondary | ICD-10-CM | POA: Diagnosis not present

## 2015-05-10 DIAGNOSIS — Z6838 Body mass index (BMI) 38.0-38.9, adult: Secondary | ICD-10-CM | POA: Diagnosis not present

## 2015-05-10 DIAGNOSIS — J441 Chronic obstructive pulmonary disease with (acute) exacerbation: Secondary | ICD-10-CM | POA: Diagnosis not present

## 2015-05-10 DIAGNOSIS — E1149 Type 2 diabetes mellitus with other diabetic neurological complication: Secondary | ICD-10-CM | POA: Diagnosis not present

## 2015-05-13 DIAGNOSIS — Z6839 Body mass index (BMI) 39.0-39.9, adult: Secondary | ICD-10-CM | POA: Diagnosis not present

## 2015-05-13 DIAGNOSIS — J441 Chronic obstructive pulmonary disease with (acute) exacerbation: Secondary | ICD-10-CM | POA: Diagnosis not present

## 2015-05-27 DIAGNOSIS — Z6839 Body mass index (BMI) 39.0-39.9, adult: Secondary | ICD-10-CM | POA: Diagnosis not present

## 2015-05-27 DIAGNOSIS — R0600 Dyspnea, unspecified: Secondary | ICD-10-CM | POA: Diagnosis not present

## 2015-05-27 DIAGNOSIS — J441 Chronic obstructive pulmonary disease with (acute) exacerbation: Secondary | ICD-10-CM | POA: Diagnosis not present

## 2015-06-02 DIAGNOSIS — J441 Chronic obstructive pulmonary disease with (acute) exacerbation: Secondary | ICD-10-CM | POA: Diagnosis not present

## 2015-06-02 DIAGNOSIS — Z6839 Body mass index (BMI) 39.0-39.9, adult: Secondary | ICD-10-CM | POA: Diagnosis not present

## 2015-06-15 DIAGNOSIS — Z87891 Personal history of nicotine dependence: Secondary | ICD-10-CM | POA: Diagnosis not present

## 2015-06-15 DIAGNOSIS — J301 Allergic rhinitis due to pollen: Secondary | ICD-10-CM | POA: Diagnosis not present

## 2015-06-15 DIAGNOSIS — G4733 Obstructive sleep apnea (adult) (pediatric): Secondary | ICD-10-CM | POA: Diagnosis not present

## 2015-06-15 DIAGNOSIS — J449 Chronic obstructive pulmonary disease, unspecified: Secondary | ICD-10-CM | POA: Diagnosis not present

## 2015-07-08 DIAGNOSIS — Z23 Encounter for immunization: Secondary | ICD-10-CM | POA: Diagnosis not present

## 2015-07-12 DIAGNOSIS — E1149 Type 2 diabetes mellitus with other diabetic neurological complication: Secondary | ICD-10-CM | POA: Diagnosis not present

## 2015-07-12 DIAGNOSIS — E1142 Type 2 diabetes mellitus with diabetic polyneuropathy: Secondary | ICD-10-CM | POA: Diagnosis not present

## 2015-07-12 DIAGNOSIS — I10 Essential (primary) hypertension: Secondary | ICD-10-CM | POA: Diagnosis not present

## 2015-07-12 DIAGNOSIS — M47816 Spondylosis without myelopathy or radiculopathy, lumbar region: Secondary | ICD-10-CM | POA: Diagnosis not present

## 2015-07-12 DIAGNOSIS — G8929 Other chronic pain: Secondary | ICD-10-CM | POA: Diagnosis not present

## 2015-07-12 DIAGNOSIS — E785 Hyperlipidemia, unspecified: Secondary | ICD-10-CM | POA: Diagnosis not present

## 2015-07-12 DIAGNOSIS — Z6841 Body Mass Index (BMI) 40.0 and over, adult: Secondary | ICD-10-CM | POA: Diagnosis not present

## 2015-07-19 DIAGNOSIS — I10 Essential (primary) hypertension: Secondary | ICD-10-CM | POA: Diagnosis not present

## 2015-07-19 DIAGNOSIS — E1065 Type 1 diabetes mellitus with hyperglycemia: Secondary | ICD-10-CM | POA: Diagnosis not present

## 2015-07-19 DIAGNOSIS — E785 Hyperlipidemia, unspecified: Secondary | ICD-10-CM | POA: Diagnosis not present

## 2015-07-22 DIAGNOSIS — J441 Chronic obstructive pulmonary disease with (acute) exacerbation: Secondary | ICD-10-CM | POA: Diagnosis not present

## 2015-07-22 DIAGNOSIS — Z6841 Body Mass Index (BMI) 40.0 and over, adult: Secondary | ICD-10-CM | POA: Diagnosis not present

## 2015-09-07 DIAGNOSIS — J189 Pneumonia, unspecified organism: Secondary | ICD-10-CM | POA: Diagnosis not present

## 2015-09-07 DIAGNOSIS — Z6841 Body Mass Index (BMI) 40.0 and over, adult: Secondary | ICD-10-CM | POA: Diagnosis not present

## 2015-09-14 DIAGNOSIS — J441 Chronic obstructive pulmonary disease with (acute) exacerbation: Secondary | ICD-10-CM | POA: Diagnosis not present

## 2015-09-14 DIAGNOSIS — K5900 Constipation, unspecified: Secondary | ICD-10-CM | POA: Diagnosis not present

## 2015-09-14 DIAGNOSIS — Z6841 Body Mass Index (BMI) 40.0 and over, adult: Secondary | ICD-10-CM | POA: Diagnosis not present

## 2015-10-05 DIAGNOSIS — E785 Hyperlipidemia, unspecified: Secondary | ICD-10-CM | POA: Diagnosis not present

## 2015-10-05 DIAGNOSIS — Z720 Tobacco use: Secondary | ICD-10-CM | POA: Diagnosis not present

## 2015-10-05 DIAGNOSIS — J441 Chronic obstructive pulmonary disease with (acute) exacerbation: Secondary | ICD-10-CM | POA: Diagnosis not present

## 2015-10-05 DIAGNOSIS — I10 Essential (primary) hypertension: Secondary | ICD-10-CM | POA: Diagnosis not present

## 2015-10-05 DIAGNOSIS — E782 Mixed hyperlipidemia: Secondary | ICD-10-CM | POA: Diagnosis not present

## 2015-10-05 DIAGNOSIS — R0989 Other specified symptoms and signs involving the circulatory and respiratory systems: Secondary | ICD-10-CM | POA: Diagnosis not present

## 2015-10-05 DIAGNOSIS — R079 Chest pain, unspecified: Secondary | ICD-10-CM | POA: Diagnosis not present

## 2015-10-05 DIAGNOSIS — M1990 Unspecified osteoarthritis, unspecified site: Secondary | ICD-10-CM | POA: Diagnosis present

## 2015-10-05 DIAGNOSIS — Z6841 Body Mass Index (BMI) 40.0 and over, adult: Secondary | ICD-10-CM | POA: Diagnosis not present

## 2015-10-05 DIAGNOSIS — Z6839 Body mass index (BMI) 39.0-39.9, adult: Secondary | ICD-10-CM | POA: Diagnosis not present

## 2015-10-05 DIAGNOSIS — J45909 Unspecified asthma, uncomplicated: Secondary | ICD-10-CM | POA: Diagnosis not present

## 2015-10-05 DIAGNOSIS — I25118 Atherosclerotic heart disease of native coronary artery with other forms of angina pectoris: Secondary | ICD-10-CM | POA: Diagnosis present

## 2015-10-05 DIAGNOSIS — R0602 Shortness of breath: Secondary | ICD-10-CM | POA: Diagnosis not present

## 2015-10-05 DIAGNOSIS — E119 Type 2 diabetes mellitus without complications: Secondary | ICD-10-CM | POA: Diagnosis not present

## 2015-10-05 DIAGNOSIS — J440 Chronic obstructive pulmonary disease with acute lower respiratory infection: Secondary | ICD-10-CM | POA: Diagnosis not present

## 2015-10-05 DIAGNOSIS — R072 Precordial pain: Secondary | ICD-10-CM | POA: Diagnosis not present

## 2015-10-05 DIAGNOSIS — J209 Acute bronchitis, unspecified: Secondary | ICD-10-CM | POA: Diagnosis not present

## 2015-10-05 DIAGNOSIS — Z79899 Other long term (current) drug therapy: Secondary | ICD-10-CM | POA: Diagnosis not present

## 2015-10-05 DIAGNOSIS — E6609 Other obesity due to excess calories: Secondary | ICD-10-CM | POA: Diagnosis present

## 2015-10-05 DIAGNOSIS — Z880 Allergy status to penicillin: Secondary | ICD-10-CM | POA: Diagnosis not present

## 2015-10-05 DIAGNOSIS — F17210 Nicotine dependence, cigarettes, uncomplicated: Secondary | ICD-10-CM | POA: Diagnosis present

## 2015-10-05 DIAGNOSIS — E7800 Pure hypercholesterolemia, unspecified: Secondary | ICD-10-CM | POA: Diagnosis present

## 2015-10-11 DIAGNOSIS — E785 Hyperlipidemia, unspecified: Secondary | ICD-10-CM | POA: Diagnosis not present

## 2015-10-11 DIAGNOSIS — J441 Chronic obstructive pulmonary disease with (acute) exacerbation: Secondary | ICD-10-CM | POA: Diagnosis not present

## 2015-10-11 DIAGNOSIS — I10 Essential (primary) hypertension: Secondary | ICD-10-CM | POA: Diagnosis not present

## 2015-10-11 DIAGNOSIS — G8929 Other chronic pain: Secondary | ICD-10-CM | POA: Diagnosis not present

## 2015-10-11 DIAGNOSIS — E1142 Type 2 diabetes mellitus with diabetic polyneuropathy: Secondary | ICD-10-CM | POA: Diagnosis not present

## 2015-10-11 DIAGNOSIS — E1149 Type 2 diabetes mellitus with other diabetic neurological complication: Secondary | ICD-10-CM | POA: Diagnosis not present

## 2015-10-11 DIAGNOSIS — M47816 Spondylosis without myelopathy or radiculopathy, lumbar region: Secondary | ICD-10-CM | POA: Diagnosis not present

## 2015-10-11 DIAGNOSIS — E1140 Type 2 diabetes mellitus with diabetic neuropathy, unspecified: Secondary | ICD-10-CM | POA: Diagnosis not present

## 2015-10-13 DIAGNOSIS — R079 Chest pain, unspecified: Secondary | ICD-10-CM | POA: Diagnosis not present

## 2015-10-13 DIAGNOSIS — F17200 Nicotine dependence, unspecified, uncomplicated: Secondary | ICD-10-CM | POA: Diagnosis not present

## 2015-10-13 DIAGNOSIS — I2510 Atherosclerotic heart disease of native coronary artery without angina pectoris: Secondary | ICD-10-CM | POA: Diagnosis not present

## 2015-10-13 DIAGNOSIS — Z794 Long term (current) use of insulin: Secondary | ICD-10-CM | POA: Diagnosis not present

## 2015-10-13 DIAGNOSIS — I10 Essential (primary) hypertension: Secondary | ICD-10-CM | POA: Diagnosis not present

## 2015-10-13 DIAGNOSIS — J42 Unspecified chronic bronchitis: Secondary | ICD-10-CM | POA: Diagnosis not present

## 2015-10-13 DIAGNOSIS — E119 Type 2 diabetes mellitus without complications: Secondary | ICD-10-CM | POA: Diagnosis not present

## 2015-10-17 DIAGNOSIS — R0602 Shortness of breath: Secondary | ICD-10-CM | POA: Diagnosis not present

## 2015-10-17 DIAGNOSIS — J449 Chronic obstructive pulmonary disease, unspecified: Secondary | ICD-10-CM | POA: Diagnosis not present

## 2015-10-17 DIAGNOSIS — F17200 Nicotine dependence, unspecified, uncomplicated: Secondary | ICD-10-CM | POA: Diagnosis not present

## 2015-10-17 DIAGNOSIS — E119 Type 2 diabetes mellitus without complications: Secondary | ICD-10-CM | POA: Diagnosis not present

## 2015-10-17 DIAGNOSIS — I2510 Atherosclerotic heart disease of native coronary artery without angina pectoris: Secondary | ICD-10-CM | POA: Diagnosis not present

## 2015-10-17 DIAGNOSIS — I25118 Atherosclerotic heart disease of native coronary artery with other forms of angina pectoris: Secondary | ICD-10-CM | POA: Diagnosis not present

## 2015-10-17 DIAGNOSIS — R0789 Other chest pain: Secondary | ICD-10-CM | POA: Diagnosis not present

## 2015-10-17 DIAGNOSIS — I10 Essential (primary) hypertension: Secondary | ICD-10-CM | POA: Diagnosis not present

## 2015-10-17 DIAGNOSIS — J42 Unspecified chronic bronchitis: Secondary | ICD-10-CM | POA: Diagnosis not present

## 2015-10-20 DIAGNOSIS — E875 Hyperkalemia: Secondary | ICD-10-CM | POA: Diagnosis not present

## 2015-10-29 DIAGNOSIS — R69 Illness, unspecified: Secondary | ICD-10-CM | POA: Diagnosis not present

## 2015-10-29 DIAGNOSIS — J329 Chronic sinusitis, unspecified: Secondary | ICD-10-CM | POA: Diagnosis not present

## 2015-10-29 DIAGNOSIS — Z6841 Body Mass Index (BMI) 40.0 and over, adult: Secondary | ICD-10-CM | POA: Diagnosis not present

## 2015-11-18 DIAGNOSIS — R0600 Dyspnea, unspecified: Secondary | ICD-10-CM | POA: Diagnosis not present

## 2015-11-18 DIAGNOSIS — J441 Chronic obstructive pulmonary disease with (acute) exacerbation: Secondary | ICD-10-CM | POA: Diagnosis not present

## 2015-11-18 DIAGNOSIS — Z6841 Body Mass Index (BMI) 40.0 and over, adult: Secondary | ICD-10-CM | POA: Diagnosis not present

## 2015-12-02 DIAGNOSIS — J441 Chronic obstructive pulmonary disease with (acute) exacerbation: Secondary | ICD-10-CM | POA: Diagnosis not present

## 2015-12-02 DIAGNOSIS — Z6841 Body Mass Index (BMI) 40.0 and over, adult: Secondary | ICD-10-CM | POA: Diagnosis not present

## 2016-01-05 DIAGNOSIS — Z6841 Body Mass Index (BMI) 40.0 and over, adult: Secondary | ICD-10-CM | POA: Diagnosis not present

## 2016-01-05 DIAGNOSIS — M47816 Spondylosis without myelopathy or radiculopathy, lumbar region: Secondary | ICD-10-CM | POA: Diagnosis not present

## 2016-01-05 DIAGNOSIS — G8929 Other chronic pain: Secondary | ICD-10-CM | POA: Diagnosis not present

## 2016-01-13 DIAGNOSIS — J101 Influenza due to other identified influenza virus with other respiratory manifestations: Secondary | ICD-10-CM | POA: Diagnosis not present

## 2016-01-13 DIAGNOSIS — J441 Chronic obstructive pulmonary disease with (acute) exacerbation: Secondary | ICD-10-CM | POA: Diagnosis not present

## 2016-01-13 DIAGNOSIS — Z6841 Body Mass Index (BMI) 40.0 and over, adult: Secondary | ICD-10-CM | POA: Diagnosis not present

## 2016-01-19 DIAGNOSIS — Z6841 Body Mass Index (BMI) 40.0 and over, adult: Secondary | ICD-10-CM | POA: Diagnosis not present

## 2016-01-19 DIAGNOSIS — J441 Chronic obstructive pulmonary disease with (acute) exacerbation: Secondary | ICD-10-CM | POA: Diagnosis not present

## 2016-02-10 DIAGNOSIS — G894 Chronic pain syndrome: Secondary | ICD-10-CM | POA: Diagnosis not present

## 2016-02-10 DIAGNOSIS — J449 Chronic obstructive pulmonary disease, unspecified: Secondary | ICD-10-CM | POA: Diagnosis not present

## 2016-02-10 DIAGNOSIS — E119 Type 2 diabetes mellitus without complications: Secondary | ICD-10-CM | POA: Diagnosis not present

## 2016-02-10 DIAGNOSIS — M542 Cervicalgia: Secondary | ICD-10-CM | POA: Diagnosis not present

## 2016-02-10 DIAGNOSIS — M1288 Other specific arthropathies, not elsewhere classified, other specified site: Secondary | ICD-10-CM | POA: Diagnosis not present

## 2016-02-13 DIAGNOSIS — G894 Chronic pain syndrome: Secondary | ICD-10-CM | POA: Diagnosis not present

## 2016-02-13 DIAGNOSIS — J449 Chronic obstructive pulmonary disease, unspecified: Secondary | ICD-10-CM | POA: Diagnosis not present

## 2016-02-13 DIAGNOSIS — M1288 Other specific arthropathies, not elsewhere classified, other specified site: Secondary | ICD-10-CM | POA: Diagnosis not present

## 2016-02-13 DIAGNOSIS — E119 Type 2 diabetes mellitus without complications: Secondary | ICD-10-CM | POA: Diagnosis not present

## 2016-02-13 DIAGNOSIS — M542 Cervicalgia: Secondary | ICD-10-CM | POA: Diagnosis not present

## 2016-02-13 DIAGNOSIS — M5136 Other intervertebral disc degeneration, lumbar region: Secondary | ICD-10-CM | POA: Diagnosis not present

## 2016-03-12 DIAGNOSIS — M542 Cervicalgia: Secondary | ICD-10-CM | POA: Diagnosis not present

## 2016-03-12 DIAGNOSIS — G894 Chronic pain syndrome: Secondary | ICD-10-CM | POA: Diagnosis not present

## 2016-03-12 DIAGNOSIS — E119 Type 2 diabetes mellitus without complications: Secondary | ICD-10-CM | POA: Diagnosis not present

## 2016-03-12 DIAGNOSIS — J449 Chronic obstructive pulmonary disease, unspecified: Secondary | ICD-10-CM | POA: Diagnosis not present

## 2016-03-12 DIAGNOSIS — M1288 Other specific arthropathies, not elsewhere classified, other specified site: Secondary | ICD-10-CM | POA: Diagnosis not present

## 2016-03-19 DIAGNOSIS — J441 Chronic obstructive pulmonary disease with (acute) exacerbation: Secondary | ICD-10-CM | POA: Diagnosis not present

## 2016-03-19 DIAGNOSIS — R0600 Dyspnea, unspecified: Secondary | ICD-10-CM | POA: Diagnosis not present

## 2016-03-19 DIAGNOSIS — Z6841 Body Mass Index (BMI) 40.0 and over, adult: Secondary | ICD-10-CM | POA: Diagnosis not present

## 2016-04-09 DIAGNOSIS — G894 Chronic pain syndrome: Secondary | ICD-10-CM | POA: Diagnosis not present

## 2016-04-09 DIAGNOSIS — Z1389 Encounter for screening for other disorder: Secondary | ICD-10-CM | POA: Diagnosis not present

## 2016-04-09 DIAGNOSIS — E119 Type 2 diabetes mellitus without complications: Secondary | ICD-10-CM | POA: Diagnosis not present

## 2016-04-09 DIAGNOSIS — M542 Cervicalgia: Secondary | ICD-10-CM | POA: Diagnosis not present

## 2016-04-09 DIAGNOSIS — F329 Major depressive disorder, single episode, unspecified: Secondary | ICD-10-CM | POA: Diagnosis not present

## 2016-04-09 DIAGNOSIS — J449 Chronic obstructive pulmonary disease, unspecified: Secondary | ICD-10-CM | POA: Diagnosis not present

## 2016-04-09 DIAGNOSIS — M1288 Other specific arthropathies, not elsewhere classified, other specified site: Secondary | ICD-10-CM | POA: Diagnosis not present

## 2016-04-13 DIAGNOSIS — G8929 Other chronic pain: Secondary | ICD-10-CM | POA: Diagnosis not present

## 2016-04-13 DIAGNOSIS — I10 Essential (primary) hypertension: Secondary | ICD-10-CM | POA: Diagnosis not present

## 2016-04-13 DIAGNOSIS — E1149 Type 2 diabetes mellitus with other diabetic neurological complication: Secondary | ICD-10-CM | POA: Diagnosis not present

## 2016-04-13 DIAGNOSIS — J449 Chronic obstructive pulmonary disease, unspecified: Secondary | ICD-10-CM | POA: Diagnosis not present

## 2016-04-13 DIAGNOSIS — E1140 Type 2 diabetes mellitus with diabetic neuropathy, unspecified: Secondary | ICD-10-CM | POA: Diagnosis not present

## 2016-04-13 DIAGNOSIS — Z6841 Body Mass Index (BMI) 40.0 and over, adult: Secondary | ICD-10-CM | POA: Diagnosis not present

## 2016-04-13 DIAGNOSIS — E1142 Type 2 diabetes mellitus with diabetic polyneuropathy: Secondary | ICD-10-CM | POA: Diagnosis not present

## 2016-06-15 DIAGNOSIS — Z6841 Body Mass Index (BMI) 40.0 and over, adult: Secondary | ICD-10-CM | POA: Diagnosis not present

## 2016-06-15 DIAGNOSIS — J9610 Chronic respiratory failure, unspecified whether with hypoxia or hypercapnia: Secondary | ICD-10-CM | POA: Diagnosis not present

## 2016-06-15 DIAGNOSIS — J441 Chronic obstructive pulmonary disease with (acute) exacerbation: Secondary | ICD-10-CM | POA: Diagnosis not present

## 2016-06-28 DIAGNOSIS — J441 Chronic obstructive pulmonary disease with (acute) exacerbation: Secondary | ICD-10-CM | POA: Diagnosis not present

## 2016-08-10 DIAGNOSIS — Z23 Encounter for immunization: Secondary | ICD-10-CM | POA: Diagnosis not present

## 2016-08-13 DIAGNOSIS — J111 Influenza due to unidentified influenza virus with other respiratory manifestations: Secondary | ICD-10-CM | POA: Diagnosis not present

## 2016-08-13 DIAGNOSIS — J441 Chronic obstructive pulmonary disease with (acute) exacerbation: Secondary | ICD-10-CM | POA: Diagnosis not present

## 2016-08-20 DIAGNOSIS — J441 Chronic obstructive pulmonary disease with (acute) exacerbation: Secondary | ICD-10-CM | POA: Diagnosis not present

## 2016-09-16 DIAGNOSIS — E7800 Pure hypercholesterolemia, unspecified: Secondary | ICD-10-CM | POA: Diagnosis present

## 2016-09-16 DIAGNOSIS — R0603 Acute respiratory distress: Secondary | ICD-10-CM | POA: Diagnosis not present

## 2016-09-16 DIAGNOSIS — F17210 Nicotine dependence, cigarettes, uncomplicated: Secondary | ICD-10-CM | POA: Diagnosis not present

## 2016-09-16 DIAGNOSIS — E119 Type 2 diabetes mellitus without complications: Secondary | ICD-10-CM | POA: Diagnosis present

## 2016-09-16 DIAGNOSIS — Z7982 Long term (current) use of aspirin: Secondary | ICD-10-CM | POA: Diagnosis not present

## 2016-09-16 DIAGNOSIS — J441 Chronic obstructive pulmonary disease with (acute) exacerbation: Secondary | ICD-10-CM | POA: Diagnosis not present

## 2016-09-16 DIAGNOSIS — Z79899 Other long term (current) drug therapy: Secondary | ICD-10-CM | POA: Diagnosis not present

## 2016-09-16 DIAGNOSIS — R079 Chest pain, unspecified: Secondary | ICD-10-CM | POA: Diagnosis not present

## 2016-09-16 DIAGNOSIS — Z6841 Body Mass Index (BMI) 40.0 and over, adult: Secondary | ICD-10-CM | POA: Diagnosis not present

## 2016-09-16 DIAGNOSIS — J440 Chronic obstructive pulmonary disease with acute lower respiratory infection: Secondary | ICD-10-CM | POA: Diagnosis present

## 2016-09-16 DIAGNOSIS — J9690 Respiratory failure, unspecified, unspecified whether with hypoxia or hypercapnia: Secondary | ICD-10-CM | POA: Diagnosis not present

## 2016-09-16 DIAGNOSIS — R0781 Pleurodynia: Secondary | ICD-10-CM | POA: Diagnosis not present

## 2016-09-16 DIAGNOSIS — E785 Hyperlipidemia, unspecified: Secondary | ICD-10-CM

## 2016-09-16 DIAGNOSIS — J9600 Acute respiratory failure, unspecified whether with hypoxia or hypercapnia: Secondary | ICD-10-CM | POA: Diagnosis not present

## 2016-09-16 DIAGNOSIS — R0602 Shortness of breath: Secondary | ICD-10-CM | POA: Diagnosis not present

## 2016-09-16 DIAGNOSIS — M1990 Unspecified osteoarthritis, unspecified site: Secondary | ICD-10-CM | POA: Diagnosis present

## 2016-09-16 DIAGNOSIS — I2510 Atherosclerotic heart disease of native coronary artery without angina pectoris: Secondary | ICD-10-CM | POA: Diagnosis present

## 2016-09-16 DIAGNOSIS — J180 Bronchopneumonia, unspecified organism: Secondary | ICD-10-CM | POA: Diagnosis not present

## 2016-09-16 DIAGNOSIS — J9610 Chronic respiratory failure, unspecified whether with hypoxia or hypercapnia: Secondary | ICD-10-CM | POA: Diagnosis not present

## 2016-09-16 DIAGNOSIS — G4733 Obstructive sleep apnea (adult) (pediatric): Secondary | ICD-10-CM | POA: Diagnosis not present

## 2016-09-16 DIAGNOSIS — Z880 Allergy status to penicillin: Secondary | ICD-10-CM | POA: Diagnosis not present

## 2016-09-19 DIAGNOSIS — J441 Chronic obstructive pulmonary disease with (acute) exacerbation: Secondary | ICD-10-CM

## 2016-09-19 DIAGNOSIS — R079 Chest pain, unspecified: Secondary | ICD-10-CM

## 2016-09-26 DIAGNOSIS — E0842 Diabetes mellitus due to underlying condition with diabetic polyneuropathy: Secondary | ICD-10-CM | POA: Diagnosis not present

## 2016-09-26 DIAGNOSIS — J441 Chronic obstructive pulmonary disease with (acute) exacerbation: Secondary | ICD-10-CM | POA: Diagnosis not present

## 2016-09-26 DIAGNOSIS — E1149 Type 2 diabetes mellitus with other diabetic neurological complication: Secondary | ICD-10-CM | POA: Diagnosis not present

## 2016-09-26 DIAGNOSIS — M47816 Spondylosis without myelopathy or radiculopathy, lumbar region: Secondary | ICD-10-CM | POA: Diagnosis not present

## 2016-09-26 DIAGNOSIS — F324 Major depressive disorder, single episode, in partial remission: Secondary | ICD-10-CM | POA: Diagnosis not present

## 2016-09-26 DIAGNOSIS — G4733 Obstructive sleep apnea (adult) (pediatric): Secondary | ICD-10-CM | POA: Diagnosis not present

## 2016-10-08 HISTORY — DX: Morbid (severe) obesity due to excess calories: E66.01

## 2016-11-06 DIAGNOSIS — Z720 Tobacco use: Secondary | ICD-10-CM

## 2016-11-06 DIAGNOSIS — J189 Pneumonia, unspecified organism: Secondary | ICD-10-CM | POA: Diagnosis not present

## 2016-11-06 DIAGNOSIS — E119 Type 2 diabetes mellitus without complications: Secondary | ICD-10-CM

## 2016-11-06 DIAGNOSIS — E785 Hyperlipidemia, unspecified: Secondary | ICD-10-CM

## 2016-11-06 DIAGNOSIS — J441 Chronic obstructive pulmonary disease with (acute) exacerbation: Secondary | ICD-10-CM

## 2016-11-06 DIAGNOSIS — G4733 Obstructive sleep apnea (adult) (pediatric): Secondary | ICD-10-CM

## 2016-11-06 DIAGNOSIS — J9611 Chronic respiratory failure with hypoxia: Secondary | ICD-10-CM

## 2016-11-06 DIAGNOSIS — I10 Essential (primary) hypertension: Secondary | ICD-10-CM

## 2016-11-07 DIAGNOSIS — Z720 Tobacco use: Secondary | ICD-10-CM

## 2016-11-07 DIAGNOSIS — E119 Type 2 diabetes mellitus without complications: Secondary | ICD-10-CM

## 2016-11-07 DIAGNOSIS — J189 Pneumonia, unspecified organism: Secondary | ICD-10-CM

## 2016-11-07 DIAGNOSIS — J441 Chronic obstructive pulmonary disease with (acute) exacerbation: Secondary | ICD-10-CM

## 2016-11-07 DIAGNOSIS — E661 Drug-induced obesity: Secondary | ICD-10-CM

## 2016-11-07 DIAGNOSIS — A419 Sepsis, unspecified organism: Secondary | ICD-10-CM

## 2016-11-07 DIAGNOSIS — I10 Essential (primary) hypertension: Secondary | ICD-10-CM

## 2016-11-07 DIAGNOSIS — J9611 Chronic respiratory failure with hypoxia: Secondary | ICD-10-CM

## 2016-11-08 DIAGNOSIS — J9611 Chronic respiratory failure with hypoxia: Secondary | ICD-10-CM | POA: Diagnosis not present

## 2016-11-08 DIAGNOSIS — A419 Sepsis, unspecified organism: Secondary | ICD-10-CM | POA: Diagnosis not present

## 2016-11-08 DIAGNOSIS — J441 Chronic obstructive pulmonary disease with (acute) exacerbation: Secondary | ICD-10-CM | POA: Diagnosis not present

## 2016-11-08 DIAGNOSIS — I10 Essential (primary) hypertension: Secondary | ICD-10-CM | POA: Diagnosis not present

## 2016-11-09 ENCOUNTER — Inpatient Hospital Stay (HOSPITAL_COMMUNITY)
Admission: AD | Admit: 2016-11-09 | Discharge: 2016-12-06 | DRG: 870 | Disposition: E | Payer: Medicare Other | Source: Other Acute Inpatient Hospital | Attending: Pulmonary Disease | Admitting: Pulmonary Disease

## 2016-11-09 DIAGNOSIS — J1000 Influenza due to other identified influenza virus with unspecified type of pneumonia: Secondary | ICD-10-CM | POA: Diagnosis present

## 2016-11-09 DIAGNOSIS — I10 Essential (primary) hypertension: Secondary | ICD-10-CM | POA: Diagnosis present

## 2016-11-09 DIAGNOSIS — Z01818 Encounter for other preprocedural examination: Secondary | ICD-10-CM

## 2016-11-09 DIAGNOSIS — E8770 Fluid overload, unspecified: Secondary | ICD-10-CM | POA: Diagnosis not present

## 2016-11-09 DIAGNOSIS — I959 Hypotension, unspecified: Secondary | ICD-10-CM | POA: Diagnosis present

## 2016-11-09 DIAGNOSIS — Z452 Encounter for adjustment and management of vascular access device: Secondary | ICD-10-CM | POA: Diagnosis not present

## 2016-11-09 DIAGNOSIS — J440 Chronic obstructive pulmonary disease with acute lower respiratory infection: Secondary | ICD-10-CM | POA: Diagnosis present

## 2016-11-09 DIAGNOSIS — J9620 Acute and chronic respiratory failure, unspecified whether with hypoxia or hypercapnia: Secondary | ICD-10-CM

## 2016-11-09 DIAGNOSIS — D62 Acute posthemorrhagic anemia: Secondary | ICD-10-CM | POA: Diagnosis not present

## 2016-11-09 DIAGNOSIS — J9690 Respiratory failure, unspecified, unspecified whether with hypoxia or hypercapnia: Secondary | ICD-10-CM

## 2016-11-09 DIAGNOSIS — E875 Hyperkalemia: Secondary | ICD-10-CM | POA: Diagnosis present

## 2016-11-09 DIAGNOSIS — Z825 Family history of asthma and other chronic lower respiratory diseases: Secondary | ICD-10-CM

## 2016-11-09 DIAGNOSIS — J9611 Chronic respiratory failure with hypoxia: Secondary | ICD-10-CM | POA: Diagnosis not present

## 2016-11-09 DIAGNOSIS — E872 Acidosis: Secondary | ICD-10-CM | POA: Diagnosis present

## 2016-11-09 DIAGNOSIS — E662 Morbid (severe) obesity with alveolar hypoventilation: Secondary | ICD-10-CM | POA: Diagnosis present

## 2016-11-09 DIAGNOSIS — J441 Chronic obstructive pulmonary disease with (acute) exacerbation: Secondary | ICD-10-CM | POA: Diagnosis present

## 2016-11-09 DIAGNOSIS — N179 Acute kidney failure, unspecified: Secondary | ICD-10-CM

## 2016-11-09 DIAGNOSIS — F17210 Nicotine dependence, cigarettes, uncomplicated: Secondary | ICD-10-CM | POA: Diagnosis present

## 2016-11-09 DIAGNOSIS — R6521 Severe sepsis with septic shock: Secondary | ICD-10-CM | POA: Diagnosis present

## 2016-11-09 DIAGNOSIS — J811 Chronic pulmonary edema: Secondary | ICD-10-CM

## 2016-11-09 DIAGNOSIS — Y848 Other medical procedures as the cause of abnormal reaction of the patient, or of later complication, without mention of misadventure at the time of the procedure: Secondary | ICD-10-CM | POA: Diagnosis not present

## 2016-11-09 DIAGNOSIS — R58 Hemorrhage, not elsewhere classified: Secondary | ICD-10-CM | POA: Diagnosis not present

## 2016-11-09 DIAGNOSIS — Y95 Nosocomial condition: Secondary | ICD-10-CM | POA: Diagnosis present

## 2016-11-09 DIAGNOSIS — Y828 Other medical devices associated with adverse incidents: Secondary | ICD-10-CM | POA: Diagnosis not present

## 2016-11-09 DIAGNOSIS — L7632 Postprocedural hematoma of skin and subcutaneous tissue following other procedure: Secondary | ICD-10-CM | POA: Diagnosis not present

## 2016-11-09 DIAGNOSIS — Z515 Encounter for palliative care: Secondary | ICD-10-CM | POA: Diagnosis not present

## 2016-11-09 DIAGNOSIS — J111 Influenza due to unidentified influenza virus with other respiratory manifestations: Secondary | ICD-10-CM

## 2016-11-09 DIAGNOSIS — G8929 Other chronic pain: Secondary | ICD-10-CM | POA: Diagnosis present

## 2016-11-09 DIAGNOSIS — R0602 Shortness of breath: Secondary | ICD-10-CM | POA: Diagnosis present

## 2016-11-09 DIAGNOSIS — R0600 Dyspnea, unspecified: Secondary | ICD-10-CM | POA: Diagnosis not present

## 2016-11-09 DIAGNOSIS — F419 Anxiety disorder, unspecified: Secondary | ICD-10-CM | POA: Diagnosis present

## 2016-11-09 DIAGNOSIS — N170 Acute kidney failure with tubular necrosis: Secondary | ICD-10-CM | POA: Diagnosis present

## 2016-11-09 DIAGNOSIS — J9621 Acute and chronic respiratory failure with hypoxia: Secondary | ICD-10-CM | POA: Diagnosis present

## 2016-11-09 DIAGNOSIS — J9622 Acute and chronic respiratory failure with hypercapnia: Secondary | ICD-10-CM | POA: Diagnosis present

## 2016-11-09 DIAGNOSIS — Z880 Allergy status to penicillin: Secondary | ICD-10-CM

## 2016-11-09 DIAGNOSIS — R0603 Acute respiratory distress: Secondary | ICD-10-CM

## 2016-11-09 DIAGNOSIS — K5900 Constipation, unspecified: Secondary | ICD-10-CM | POA: Diagnosis not present

## 2016-11-09 DIAGNOSIS — Z7189 Other specified counseling: Secondary | ICD-10-CM | POA: Diagnosis not present

## 2016-11-09 DIAGNOSIS — I4891 Unspecified atrial fibrillation: Secondary | ICD-10-CM | POA: Diagnosis present

## 2016-11-09 DIAGNOSIS — D696 Thrombocytopenia, unspecified: Secondary | ICD-10-CM | POA: Diagnosis present

## 2016-11-09 DIAGNOSIS — Z8249 Family history of ischemic heart disease and other diseases of the circulatory system: Secondary | ICD-10-CM

## 2016-11-09 DIAGNOSIS — J189 Pneumonia, unspecified organism: Secondary | ICD-10-CM

## 2016-11-09 DIAGNOSIS — Z6841 Body Mass Index (BMI) 40.0 and over, adult: Secondary | ICD-10-CM | POA: Diagnosis not present

## 2016-11-09 DIAGNOSIS — Z4659 Encounter for fitting and adjustment of other gastrointestinal appliance and device: Secondary | ICD-10-CM

## 2016-11-09 DIAGNOSIS — R410 Disorientation, unspecified: Secondary | ICD-10-CM | POA: Diagnosis not present

## 2016-11-09 DIAGNOSIS — A419 Sepsis, unspecified organism: Secondary | ICD-10-CM | POA: Diagnosis present

## 2016-11-09 DIAGNOSIS — E119 Type 2 diabetes mellitus without complications: Secondary | ICD-10-CM | POA: Diagnosis present

## 2016-11-09 DIAGNOSIS — Z9119 Patient's noncompliance with other medical treatment and regimen: Secondary | ICD-10-CM

## 2016-11-09 DIAGNOSIS — R34 Anuria and oliguria: Secondary | ICD-10-CM | POA: Diagnosis present

## 2016-11-09 DIAGNOSIS — R195 Other fecal abnormalities: Secondary | ICD-10-CM | POA: Diagnosis not present

## 2016-11-09 DIAGNOSIS — Z66 Do not resuscitate: Secondary | ICD-10-CM | POA: Diagnosis present

## 2016-11-09 DIAGNOSIS — J9601 Acute respiratory failure with hypoxia: Secondary | ICD-10-CM | POA: Diagnosis not present

## 2016-11-09 DIAGNOSIS — R680 Hypothermia, not associated with low environmental temperature: Secondary | ICD-10-CM | POA: Diagnosis not present

## 2016-11-09 HISTORY — DX: Morbid (severe) obesity due to excess calories: E66.01

## 2016-11-09 HISTORY — DX: Dyspnea, unspecified: R06.00

## 2016-11-09 MED ORDER — IPRATROPIUM-ALBUTEROL 0.5-2.5 (3) MG/3ML IN SOLN
3.0000 mL | Freq: Four times a day (QID) | RESPIRATORY_TRACT | Status: DC
  Administered 2016-11-09 – 2016-11-21 (×46): 3 mL via RESPIRATORY_TRACT
  Filled 2016-11-09 (×47): qty 3

## 2016-11-09 MED ORDER — ALBUTEROL SULFATE (2.5 MG/3ML) 0.083% IN NEBU
5.0000 mg | INHALATION_SOLUTION | RESPIRATORY_TRACT | Status: AC
  Administered 2016-11-09: 5 mg via RESPIRATORY_TRACT

## 2016-11-09 MED ORDER — ALBUTEROL SULFATE (2.5 MG/3ML) 0.083% IN NEBU
INHALATION_SOLUTION | RESPIRATORY_TRACT | Status: AC
  Filled 2016-11-09: qty 6

## 2016-11-09 NOTE — Progress Notes (Signed)
Pt arrived [facility] bipap. Respiratory at bedside. MD to evaluate pt. Will continue to monitor.

## 2016-11-10 ENCOUNTER — Inpatient Hospital Stay (HOSPITAL_COMMUNITY): Payer: Medicare Other

## 2016-11-10 ENCOUNTER — Other Ambulatory Visit: Payer: Self-pay | Admitting: Internal Medicine

## 2016-11-10 ENCOUNTER — Encounter (HOSPITAL_COMMUNITY): Payer: Self-pay | Admitting: *Deleted

## 2016-11-10 DIAGNOSIS — N179 Acute kidney failure, unspecified: Secondary | ICD-10-CM

## 2016-11-10 DIAGNOSIS — J9601 Acute respiratory failure with hypoxia: Secondary | ICD-10-CM

## 2016-11-10 DIAGNOSIS — J441 Chronic obstructive pulmonary disease with (acute) exacerbation: Secondary | ICD-10-CM

## 2016-11-10 LAB — POCT ACTIVATED CLOTTING TIME
ACTIVATED CLOTTING TIME: 131 s
ACTIVATED CLOTTING TIME: 147 s
ACTIVATED CLOTTING TIME: 158 s
ACTIVATED CLOTTING TIME: 158 s
Activated Clotting Time: 103 seconds
Activated Clotting Time: 92 seconds
Activated Clotting Time: 87 seconds
Activated Clotting Time: 109 seconds
Activated Clotting Time: 114 seconds
Activated Clotting Time: 125 seconds
Activated Clotting Time: 158 seconds

## 2016-11-10 LAB — COMPREHENSIVE METABOLIC PANEL
ALBUMIN: 2.9 g/dL — AB (ref 3.5–5.0)
ALT: 34 U/L (ref 17–63)
AST: 22 U/L (ref 15–41)
Alkaline Phosphatase: 31 U/L — ABNORMAL LOW (ref 38–126)
Anion gap: 13 (ref 5–15)
BUN: 157 mg/dL — ABNORMAL HIGH (ref 6–20)
CHLORIDE: 102 mmol/L (ref 101–111)
CO2: 21 mmol/L — AB (ref 22–32)
Calcium: 8.4 mg/dL — ABNORMAL LOW (ref 8.9–10.3)
Creatinine, Ser: 4.18 mg/dL — ABNORMAL HIGH (ref 0.61–1.24)
GFR calc Af Amer: 17 mL/min — ABNORMAL LOW (ref >60)
GFR calc non Af Amer: 15 mL/min — ABNORMAL LOW (ref >60)
Glucose, Bld: 122 mg/dL — ABNORMAL HIGH (ref 65–99)
POTASSIUM: 6.5 mmol/L — AB (ref 3.5–5.1)
SODIUM: 136 mmol/L (ref 135–145)
Total Bilirubin: 0.4 mg/dL (ref 0.3–1.2)
Total Protein: 5.3 g/dL — ABNORMAL LOW (ref 6.5–8.1)

## 2016-11-10 LAB — LACTIC ACID, PLASMA
LACTIC ACID, VENOUS: 2.3 mmol/L — AB (ref 0.5–1.9)
LACTIC ACID, VENOUS: 1.8 mmol/L (ref 0.5–1.9)

## 2016-11-10 LAB — RENAL FUNCTION PANEL
Albumin: 2.6 g/dL — ABNORMAL LOW (ref 3.5–5.0)
Anion gap: 10 (ref 5–15)
BUN: 145 mg/dL — AB (ref 6–20)
CALCIUM: 7.6 mg/dL — AB (ref 8.9–10.3)
CO2: 23 mmol/L (ref 22–32)
CREATININE: 3.97 mg/dL — AB (ref 0.61–1.24)
Chloride: 102 mmol/L (ref 101–111)
GFR calc non Af Amer: 16 mL/min — ABNORMAL LOW (ref >60)
GFR, EST AFRICAN AMERICAN: 18 mL/min — AB (ref >60)
GLUCOSE: 231 mg/dL — AB (ref 65–99)
Phosphorus: 7.7 mg/dL — ABNORMAL HIGH (ref 2.5–4.6)
Potassium: 5.1 mmol/L (ref 3.5–5.1)
SODIUM: 135 mmol/L (ref 135–145)

## 2016-11-10 LAB — BASIC METABOLIC PANEL
Anion gap: 13 (ref 5–15)
BUN: 168 mg/dL — ABNORMAL HIGH (ref 6–20)
CALCIUM: 8.5 mg/dL — AB (ref 8.9–10.3)
CO2: 23 mmol/L (ref 22–32)
CREATININE: 4.11 mg/dL — AB (ref 0.61–1.24)
Chloride: 101 mmol/L (ref 101–111)
GFR calc non Af Amer: 15 mL/min — ABNORMAL LOW (ref >60)
GFR, EST AFRICAN AMERICAN: 18 mL/min — AB (ref >60)
Glucose, Bld: 136 mg/dL — ABNORMAL HIGH (ref 65–99)
Potassium: 6.4 mmol/L (ref 3.5–5.1)
SODIUM: 137 mmol/L (ref 135–145)

## 2016-11-10 LAB — CBC WITH DIFFERENTIAL/PLATELET
Basophils Absolute: 0 10*3/uL (ref 0.0–0.1)
Basophils Relative: 0 %
Eosinophils Absolute: 0 10*3/uL (ref 0.0–0.7)
Eosinophils Relative: 0 %
HCT: 28.5 % — ABNORMAL LOW (ref 39.0–52.0)
HEMOGLOBIN: 9.1 g/dL — AB (ref 13.0–17.0)
LYMPHS ABS: 1.2 10*3/uL (ref 0.7–4.0)
LYMPHS PCT: 7 %
MCH: 28.6 pg (ref 26.0–34.0)
MCHC: 31.9 g/dL (ref 30.0–36.0)
MCV: 89.6 fL (ref 78.0–100.0)
Monocytes Absolute: 0.5 10*3/uL (ref 0.1–1.0)
Monocytes Relative: 3 %
NEUTROS PCT: 90 %
Neutro Abs: 16.3 10*3/uL — ABNORMAL HIGH (ref 1.7–7.7)
Platelets: 285 10*3/uL (ref 150–400)
RBC: 3.18 MIL/uL — AB (ref 4.22–5.81)
RDW: 14.7 % (ref 11.5–15.5)
WBC: 18 10*3/uL — AB (ref 4.0–10.5)

## 2016-11-10 LAB — POCT I-STAT 3, ART BLOOD GAS (G3+)
ACID-BASE DEFICIT: 5 mmol/L — AB (ref 0.0–2.0)
BICARBONATE: 24.8 mmol/L (ref 20.0–28.0)
O2 SAT: 98 %
PCO2 ART: 68.8 mmHg — AB (ref 32.0–48.0)
PO2 ART: 127 mmHg — AB (ref 83.0–108.0)
Patient temperature: 97.3
TCO2: 27 mmol/L (ref 0–100)
pH, Arterial: 7.16 — CL (ref 7.350–7.450)

## 2016-11-10 LAB — CREATININE, URINE, RANDOM: Creatinine, Urine: 193.62 mg/dL

## 2016-11-10 LAB — BLOOD GAS, ARTERIAL
Acid-base deficit: 7.4 mmol/L — ABNORMAL HIGH (ref 0.0–2.0)
BICARBONATE: 21 mmol/L (ref 20.0–28.0)
Drawn by: 270221
FIO2: 40
LHR: 14 {breaths}/min
O2 SAT: 83.2 %
PEEP: 5 cmH2O
PH ART: 7.1 — AB (ref 7.350–7.450)
Patient temperature: 98
VT: 550 mL
pCO2 arterial: 70.2 mmHg (ref 32.0–48.0)
pO2, Arterial: 56.7 mmHg — ABNORMAL LOW (ref 83.0–108.0)

## 2016-11-10 LAB — SODIUM, URINE, RANDOM: Sodium, Ur: 33 mmol/L

## 2016-11-10 LAB — PHOSPHORUS: Phosphorus: 8.8 mg/dL — ABNORMAL HIGH (ref 2.5–4.6)

## 2016-11-10 LAB — GLUCOSE, CAPILLARY
GLUCOSE-CAPILLARY: 217 mg/dL — AB (ref 65–99)
GLUCOSE-CAPILLARY: 190 mg/dL — AB (ref 65–99)
Glucose-Capillary: 110 mg/dL — ABNORMAL HIGH (ref 65–99)
Glucose-Capillary: 123 mg/dL — ABNORMAL HIGH (ref 65–99)

## 2016-11-10 LAB — MRSA PCR SCREENING: MRSA by PCR: NEGATIVE

## 2016-11-10 LAB — URINALYSIS, ROUTINE W REFLEX MICROSCOPIC
Bilirubin Urine: NEGATIVE
Glucose, UA: NEGATIVE mg/dL
HGB URINE DIPSTICK: NEGATIVE
KETONES UR: NEGATIVE mg/dL
LEUKOCYTES UA: NEGATIVE
Nitrite: NEGATIVE
PROTEIN: NEGATIVE mg/dL
Specific Gravity, Urine: 1.017 (ref 1.005–1.030)
pH: 5 (ref 5.0–8.0)

## 2016-11-10 LAB — PROTIME-INR
INR: 1.12
Prothrombin Time: 14.4 seconds (ref 11.4–15.2)

## 2016-11-10 LAB — APTT: aPTT: 25 seconds (ref 24–36)

## 2016-11-10 LAB — PROCALCITONIN: PROCALCITONIN: 1.32 ng/mL

## 2016-11-10 LAB — MAGNESIUM: Magnesium: 2.5 mg/dL — ABNORMAL HIGH (ref 1.7–2.4)

## 2016-11-10 MED ORDER — MIDAZOLAM HCL 2 MG/2ML IJ SOLN
INTRAMUSCULAR | Status: AC
  Administered 2016-11-10: 2 mg
  Filled 2016-11-10: qty 2

## 2016-11-10 MED ORDER — ALBUTEROL SULFATE (2.5 MG/3ML) 0.083% IN NEBU: 2.5000 mg | INHALATION_SOLUTION | RESPIRATORY_TRACT | Status: DC | PRN

## 2016-11-10 MED ORDER — SODIUM CHLORIDE 0.9 % IJ SOLN
250.0000 [IU]/h | INTRAMUSCULAR | Status: DC
  Administered 2016-11-10: 450 [IU]/h via INTRAVENOUS_CENTRAL
  Administered 2016-11-10: 1250 [IU]/h via INTRAVENOUS_CENTRAL
  Administered 2016-11-11: 2500 [IU]/h via INTRAVENOUS_CENTRAL
  Administered 2016-11-11: 2350 [IU]/h via INTRAVENOUS_CENTRAL
  Administered 2016-11-11: 1950 [IU]/h via INTRAVENOUS_CENTRAL
  Administered 2016-11-11 – 2016-11-13 (×10): 2500 [IU]/h via INTRAVENOUS_CENTRAL
  Filled 2016-11-10 (×19): qty 2

## 2016-11-10 MED ORDER — MIDAZOLAM HCL 2 MG/2ML IJ SOLN
2.0000 mg | INTRAMUSCULAR | Status: DC | PRN
  Administered 2016-11-10 – 2016-11-12 (×8): 2 mg via INTRAVENOUS
  Filled 2016-11-10 (×9): qty 2

## 2016-11-10 MED ORDER — ORAL CARE MOUTH RINSE
15.0000 mL | Freq: Two times a day (BID) | OROMUCOSAL | Status: DC
  Administered 2016-11-10 (×2): 15 mL via OROMUCOSAL

## 2016-11-10 MED ORDER — DEXTROSE 5 % IV SOLN
0.0000 ug/min | INTRAVENOUS | Status: DC
  Administered 2016-11-10: 34 ug/min via INTRAVENOUS
  Administered 2016-11-10: 2 ug/min via INTRAVENOUS
  Administered 2016-11-10 (×2): 35 ug/min via INTRAVENOUS
  Filled 2016-11-10 (×5): qty 4

## 2016-11-10 MED ORDER — INSULIN ASPART 100 UNIT/ML ~~LOC~~ SOLN
0.0000 [IU] | SUBCUTANEOUS | Status: DC
  Administered 2016-11-10: 3 [IU] via SUBCUTANEOUS
  Administered 2016-11-10 (×2): 1 [IU] via SUBCUTANEOUS
  Administered 2016-11-10 – 2016-11-11 (×2): 2 [IU] via SUBCUTANEOUS
  Administered 2016-11-11: 1 [IU] via SUBCUTANEOUS
  Administered 2016-11-11: 2 [IU] via SUBCUTANEOUS
  Administered 2016-11-11: 1 [IU] via SUBCUTANEOUS
  Administered 2016-11-11: 2 [IU] via SUBCUTANEOUS
  Administered 2016-11-11: 1 [IU] via SUBCUTANEOUS
  Administered 2016-11-12 (×3): 2 [IU] via SUBCUTANEOUS
  Administered 2016-11-12: 3 [IU] via SUBCUTANEOUS
  Administered 2016-11-12 (×2): 1 [IU] via SUBCUTANEOUS
  Administered 2016-11-13 (×3): 3 [IU] via SUBCUTANEOUS

## 2016-11-10 MED ORDER — NOREPINEPHRINE BITARTRATE 1 MG/ML IV SOLN
0.0000 ug/min | INTRAVENOUS | Status: DC
  Administered 2016-11-10: 30 ug/min via INTRAVENOUS
  Administered 2016-11-11: 17 ug/min via INTRAVENOUS
  Administered 2016-11-11: 35 ug/min via INTRAVENOUS
  Administered 2016-11-12: 20 ug/min via INTRAVENOUS
  Administered 2016-11-13: 40 ug/min via INTRAVENOUS
  Filled 2016-11-10 (×8): qty 16

## 2016-11-10 MED ORDER — CHLORHEXIDINE GLUCONATE 0.12% ORAL RINSE (MEDLINE KIT)
15.0000 mL | Freq: Two times a day (BID) | OROMUCOSAL | Status: DC
  Administered 2016-11-10 – 2016-11-21 (×22): 15 mL via OROMUCOSAL

## 2016-11-10 MED ORDER — HEPARIN SODIUM (PORCINE) 1000 UNIT/ML DIALYSIS
1000.0000 [IU] | INTRAMUSCULAR | Status: DC | PRN
  Filled 2016-11-10: qty 6
  Filled 2016-11-10: qty 4

## 2016-11-10 MED ORDER — SENNOSIDES 8.8 MG/5ML PO SYRP
5.0000 mL | ORAL_SOLUTION | Freq: Two times a day (BID) | ORAL | Status: DC | PRN
Start: 2016-11-10 — End: 2016-11-21
  Filled 2016-11-10: qty 5

## 2016-11-10 MED ORDER — SODIUM CHLORIDE 0.9 % FOR CRRT
INTRAVENOUS_CENTRAL | Status: DC | PRN
  Administered 2016-11-13 – 2016-11-16 (×5): via INTRAVENOUS_CENTRAL
  Filled 2016-11-10 (×6): qty 1000

## 2016-11-10 MED ORDER — FENTANYL CITRATE (PF) 100 MCG/2ML IJ SOLN
INTRAMUSCULAR | Status: AC
  Administered 2016-11-10: 100 ug
  Filled 2016-11-10: qty 4

## 2016-11-10 MED ORDER — FENTANYL CITRATE (PF) 100 MCG/2ML IJ SOLN
INTRAMUSCULAR | Status: AC
  Administered 2016-11-10: 100 ug
  Filled 2016-11-10: qty 2

## 2016-11-10 MED ORDER — ALBUTEROL SULFATE (2.5 MG/3ML) 0.083% IN NEBU
2.5000 mg | INHALATION_SOLUTION | RESPIRATORY_TRACT | Status: DC
  Administered 2016-11-10: 2.5 mg via RESPIRATORY_TRACT
  Filled 2016-11-10: qty 3

## 2016-11-10 MED ORDER — FAMOTIDINE IN NACL 20-0.9 MG/50ML-% IV SOLN
20.0000 mg | Freq: Every day | INTRAVENOUS | Status: DC
  Administered 2016-11-10 – 2016-11-19 (×10): 20 mg via INTRAVENOUS
  Filled 2016-11-10 (×11): qty 50

## 2016-11-10 MED ORDER — PRISMASOL BGK 4/2.5 32-4-2.5 MEQ/L IV SOLN
INTRAVENOUS | Status: DC
  Administered 2016-11-10 – 2016-11-21 (×16): via INTRAVENOUS_CENTRAL
  Filled 2016-11-10 (×23): qty 5000

## 2016-11-10 MED ORDER — ONDANSETRON HCL 4 MG/2ML IJ SOLN
4.0000 mg | Freq: Four times a day (QID) | INTRAMUSCULAR | Status: DC | PRN
  Administered 2016-11-12: 4 mg via INTRAVENOUS
  Filled 2016-11-10: qty 2

## 2016-11-10 MED ORDER — FENTANYL BOLUS VIA INFUSION
50.0000 ug | INTRAVENOUS | Status: DC | PRN
  Filled 2016-11-10: qty 50

## 2016-11-10 MED ORDER — FENTANYL CITRATE (PF) 100 MCG/2ML IJ SOLN
25.0000 ug | INTRAMUSCULAR | Status: DC | PRN
  Administered 2016-11-10: 50 ug via INTRAVENOUS
  Filled 2016-11-10: qty 2

## 2016-11-10 MED ORDER — ENOXAPARIN SODIUM 30 MG/0.3ML ~~LOC~~ SOLN
30.0000 mg | Freq: Every day | SUBCUTANEOUS | Status: DC
  Administered 2016-11-10 – 2016-11-11 (×2): 30 mg via SUBCUTANEOUS
  Filled 2016-11-10 (×2): qty 0.3

## 2016-11-10 MED ORDER — HEPARIN BOLUS VIA INFUSION (CRRT)
1000.0000 [IU] | INTRAVENOUS | Status: DC | PRN
  Administered 2016-11-10 (×7): 1000 [IU] via INTRAVENOUS_CENTRAL
  Filled 2016-11-10: qty 1000

## 2016-11-10 MED ORDER — PRISMASOL BGK 4/2.5 32-4-2.5 MEQ/L IV SOLN
INTRAVENOUS | Status: DC
Start: 2016-11-10 — End: 2016-11-12
  Administered 2016-11-10 – 2016-11-12 (×16): via INTRAVENOUS_CENTRAL
  Filled 2016-11-10 (×21): qty 5000

## 2016-11-10 MED ORDER — BUDESONIDE 0.5 MG/2ML IN SUSP
0.5000 mg | Freq: Two times a day (BID) | RESPIRATORY_TRACT | Status: DC
  Administered 2016-11-10 – 2016-11-21 (×21): 0.5 mg via RESPIRATORY_TRACT
  Filled 2016-11-10 (×25): qty 2

## 2016-11-10 MED ORDER — MIDAZOLAM HCL 2 MG/2ML IJ SOLN
2.0000 mg | INTRAMUSCULAR | Status: DC | PRN
  Administered 2016-11-12: 2 mg via INTRAVENOUS
  Filled 2016-11-10: qty 2

## 2016-11-10 MED ORDER — SODIUM CHLORIDE 0.9 % IV BOLUS (SEPSIS)
500.0000 mL | Freq: Once | INTRAVENOUS | Status: AC
  Administered 2016-11-10: 500 mL via INTRAVENOUS

## 2016-11-10 MED ORDER — SODIUM POLYSTYRENE SULFONATE 15 GM/60ML PO SUSP
60.0000 g | Freq: Once | ORAL | Status: AC
  Administered 2016-11-10: 60 g via ORAL
  Filled 2016-11-10: qty 240

## 2016-11-10 MED ORDER — METHYLPREDNISOLONE SODIUM SUCC 125 MG IJ SOLR
60.0000 mg | Freq: Every day | INTRAMUSCULAR | Status: DC
  Administered 2016-11-10: 60 mg via INTRAVENOUS
  Filled 2016-11-10: qty 2

## 2016-11-10 MED ORDER — HEPARIN SODIUM (PORCINE) 1000 UNIT/ML DIALYSIS
1000.0000 [IU] | INTRAMUSCULAR | Status: DC | PRN
  Administered 2016-11-13: 2400 [IU] via INTRAVENOUS_CENTRAL
  Filled 2016-11-10 (×2): qty 6

## 2016-11-10 MED ORDER — FENTANYL CITRATE (PF) 100 MCG/2ML IJ SOLN
50.0000 ug | Freq: Once | INTRAMUSCULAR | Status: AC
  Administered 2016-11-10: 50 ug via INTRAVENOUS

## 2016-11-10 MED ORDER — ACETAMINOPHEN 325 MG PO TABS: 650.0000 mg | ORAL_TABLET | ORAL | Status: DC | PRN

## 2016-11-10 MED ORDER — ORAL CARE MOUTH RINSE
15.0000 mL | Freq: Four times a day (QID) | OROMUCOSAL | Status: DC
  Administered 2016-11-11 – 2016-11-21 (×43): 15 mL via OROMUCOSAL

## 2016-11-10 MED ORDER — ASPIRIN 300 MG RE SUPP: 300.0000 mg | RECTAL | Status: AC

## 2016-11-10 MED ORDER — SODIUM CHLORIDE 0.9 % IV BOLUS (SEPSIS): 500.0000 mL | Freq: Once | INTRAVENOUS | Status: DC

## 2016-11-10 MED ORDER — ASPIRIN 81 MG PO CHEW
324.0000 mg | CHEWABLE_TABLET | ORAL | Status: AC
  Administered 2016-11-10: 324 mg via ORAL
  Filled 2016-11-10: qty 4

## 2016-11-10 MED ORDER — METHYLPREDNISOLONE SODIUM SUCC 40 MG IJ SOLR
40.0000 mg | Freq: Two times a day (BID) | INTRAMUSCULAR | Status: DC
  Administered 2016-11-10 – 2016-11-11 (×3): 40 mg via INTRAVENOUS
  Filled 2016-11-10 (×4): qty 1

## 2016-11-10 MED ORDER — SODIUM CHLORIDE 0.9 % IV BOLUS (SEPSIS)
1000.0000 mL | Freq: Once | INTRAVENOUS | Status: AC
  Administered 2016-11-10: 1000 mL via INTRAVENOUS

## 2016-11-10 MED ORDER — SODIUM CHLORIDE 0.9 % IV SOLN
250.0000 mL | INTRAVENOUS | Status: DC | PRN
  Administered 2016-11-10 – 2016-11-13 (×3): 250 mL via INTRAVENOUS
  Administered 2016-11-16: 11:00:00 via INTRAVENOUS
  Administered 2016-11-19: 250 mL via INTRAVENOUS

## 2016-11-10 MED ORDER — PRISMASOL BGK 4/2.5 32-4-2.5 MEQ/L IV SOLN
INTRAVENOUS | Status: DC
  Administered 2016-11-10 – 2016-11-21 (×24): via INTRAVENOUS_CENTRAL
  Filled 2016-11-10 (×27): qty 5000

## 2016-11-10 MED ORDER — HEPARIN SODIUM (PORCINE) 1000 UNIT/ML DIALYSIS
1000.0000 [IU] | INTRAMUSCULAR | Status: DC | PRN
  Filled 2016-11-10: qty 6

## 2016-11-10 MED ORDER — VASOPRESSIN 20 UNIT/ML IV SOLN
0.0300 [IU]/min | INTRAVENOUS | Status: DC
  Administered 2016-11-10 – 2016-11-13 (×4): 0.03 [IU]/min via INTRAVENOUS
  Filled 2016-11-10 (×4): qty 2

## 2016-11-10 MED ORDER — FENTANYL CITRATE (PF) 2500 MCG/50ML IJ SOLN
25.0000 ug/h | INTRAMUSCULAR | Status: DC
  Administered 2016-11-10: 200 ug/h via INTRAVENOUS
  Administered 2016-11-10: 100 ug/h via INTRAVENOUS
  Administered 2016-11-11: 300 ug/h via INTRAVENOUS
  Administered 2016-11-11: 250 ug/h via INTRAVENOUS
  Administered 2016-11-12 (×4): 400 ug/h via INTRAVENOUS
  Filled 2016-11-10 (×9): qty 50

## 2016-11-10 NOTE — Progress Notes (Signed)
eLink Physician-Brief Progress Note Patient Name: Henry Watts DOB: 03/20/1961 MRN: [id number]   Date of Service  11/10/2016  HPI/Events of Note  Hyperkalemia with K of 6.4.  No EKG changes.  Had received calcium, insulin/D50 and kayexalate at OSH.  eICU Interventions  Plan: Additional kayexalate ordered     Intervention Category Major Interventions: Electrolyte abnormality - evaluation and management  DETERDING,ELIZABETH 11/10/2016, 3:17 AM

## 2016-11-10 NOTE — H&P (Addendum)
PULMONARY / CRITICAL CARE MEDICINE   Name: Henry Watts MRN: [id number] DOB: 03/20/1961    ADMISSION DATE:  11/29/2016 CONSULTATION DATE:  11/16/2016  [facility]  CHIEF COMPLAINT:  Wheezing and shortness of breath  HISTORY OF PRESENT ILLNESS:   Henry Watts is a 56 y/o man with a PMH of COPD.  He developed shortness of breath and [facility] and placed on BiPap.  He was treated for a presumed LLL pneumonia.  He was weaned off of BiPap and transferred to the floor.  The day of transfer he had an acute worsening of his shortness of breath and a blood gas showed significant respiratory acidosis.  He was placed on BiPap and [facility]. He was also noted to be hyperkalemic with acute renal failure.     PAST MEDICAL HISTORY :  He  has a past medical history of COPD (chronic obstructive pulmonary disease) (HCC) and OSA (obstructive sleep apnea).  PAST SURGICAL HISTORY: He  has a past surgical history that includes Cardiac catheterization.  Allergies  Allergen Reactions  . Penicillins Hives    No current facility-administered medications on file prior to encounter.    Current Outpatient Prescriptions on File Prior to Encounter  Medication Sig  . albuterol (PROAIR HFA) 108 (90 BASE) MCG/ACT inhaler Inhale 2 puffs into the lungs every 4 (four) hours as needed for wheezing or shortness of breath. 2 puffs every 4 hours as needed only  if your can't catch your breath  . budesonide (PULMICORT) 0.5 MG/2ML nebulizer solution Take 0.5 mg by nebulization 2 (two) times daily.  . chlorpheniramine-HYDROcodone (TUSSIONEX) 10-8 MG/5ML LQCR Take 5 mLs by mouth every 12 (twelve) hours as needed for cough.  . famotidine (PEPCID) 20 MG tablet TAKE 1 TABLET BY MOUTH AT BEDTIME  . ipratropium-albuterol (DUONEB) 0.5-2.5 (3) MG/3ML SOLN Take 3 mLs by nebulization every 4 (four) hours as needed.  Marland Kitchen. oxycodone (OXY-IR) 5  MG capsule Take 5 mg by mouth 3 (three) times daily.  . pantoprazole (PROTONIX) 40 MG tablet TAKE 1 TABLET BY MOUTH EVERY DAY, 30-60 MINUTES BEFORE FIRST MEAL OF DAY  . Umeclidinium-Vilanterol (ANORO ELLIPTA) 62.5-25 MCG/INH AEPB Inhale 2 puffs into the lungs once. Only open the device one time and then take your two separate drags to be sure you get it all    FAMILY HISTORY:  His has no family status information on file.    SOCIAL HISTORY: He  reports that he has been smoking Cigarettes.  He has a 30.00 pack-year smoking history. He has never used smokeless tobacco. He reports that he does not drink alcohol or use drugs.  REVIEW OF SYSTEMS:   A review of 14 systems was negative except as stated in the HPI.   SUBJECTIVE:  Short of breath, feeling like it is difficult to exhale.   VITAL SIGNS: BP 102/66   Pulse 80   Temp 98.4 F (36.9 C) (Oral)   Resp 11   Ht 5\' 8"  (1.727 m)   Wt 132 kg (291 lb 0.1 oz)   SpO2 100%   BMI 44.25 kg/m   HEMODYNAMICS:    VENTILATOR SETTINGS: FiO2 (%):  [40 %] 40 %  INTAKE / OUTPUT: No intake/output data recorded.  PHYSICAL EXAMINATION: General:  Obese male in mild distress, on bipap Neuro:  A&Ox4 HEENT:  PERRL, EOMI, BiPap mask in place Cardiovascular:  Distant heart sounds, NRRR, no  MRG Lungs:  Minimal air movement, bilateral scattered wheezes. Abdomen:  Soft, NTND, no palpable HSM Musculoskeletal:  Trace edema Skin:  No rashes, or other lesions  LABS:  BMET  Recent Labs Lab 11/10/16 0205  NA 136  K 6.5*  CL 102  CO2 21*  BUN 157*  CREATININE 4.18*  GLUCOSE 122*    Electrolytes  Recent Labs Lab 11/10/16 0205  CALCIUM 8.4*  MG 2.5*  PHOS 8.8*    CBC  Recent Labs Lab 11/10/16 0205  WBC 18.0*  HGB 9.1*  HCT 28.5*  PLT 285    Coag's No results for input(s): APTT, INR in the last 168 hours.  Sepsis Markers No results for input(s): LATICACIDVEN, PROCALCITON, O2SATVEN in the last 168 hours.  ABG No  results for input(s): PHART, PCO2ART, PO2ART in the last 168 hours.  Liver Enzymes  Recent Labs Lab 11/10/16 0205  AST 22  ALT 34  ALKPHOS 31*  BILITOT 0.4  ALBUMIN 2.9*    Cardiac Enzymes No results for input(s): TROPONINI, PROBNP in the last 168 hours.  Glucose  Recent Labs Lab 12/04/2016 2201  GLUCAP 110*    Imaging Dg Chest Port 1 View  Result Date: 11/10/2016 CLINICAL DATA:  Acute onset of cough.  Initial encounter. EXAM: PORTABLE CHEST 1 VIEW COMPARISON:  Chest radiograph performed 11/15/2016 FINDINGS: The lungs are well-aerated. Mild left basilar opacity may reflect mild pneumonia. Underlying left basilar atelectasis or scarring is seen. There is no evidence of pleural effusion or pneumothorax. The cardiomediastinal silhouette is within normal limits. No acute osseous abnormalities are seen. IMPRESSION: Mild left basilar opacity may reflect mild pneumonia. Underlying left basilar atelectasis or scarring seen. Electronically Signed   By: Roanna Raider M.D.   On: 11/10/2016 01:33     STUDIES:    CULTURES:   ANTIBIOTICS:   SIGNIFICANT EVENTS:   LINES/TUBES:   DISCUSSION: 56 y/o man with COPD exacerbation +/- pneumonia  ASSESSMENT / PLAN:  PULMONARY A: COPD exacerbation P:   Methylprednisone 60mg  daily Albuterol/atrovent nebs BiPap/High flow [place]   RENAL A:   ARF, Hyperkalemia P:   Hold all nephrotoxic agents Nephrology consult Kayexelate    INFECTIOUS A:   ? Pna P:   Hold abx for now.  If develops fever or other signs of infection obtain cultures and treat       FAMILY  - Updates:   - Inter-disciplinary family meet or Palliative Care meeting due by:  11/16/16  I spent 35 minutes of critical care time in the care of this patient seperate from procedures which are documented elsewhere    Sandi Carne MD, PhD Pulmonary and Critical Care Medicine [facility] Pager: [phone]  11/10/2016, 4:49 AM

## 2016-11-10 NOTE — Procedures (Signed)
Intubation Procedure Note Henry Watts [id number] 08/30/1961  Procedure: Intubation Indications: Respiratory insufficiency  Procedure Details Consent: Risks of procedure as well as the alternatives and risks of each were explained to the (patient/caregiver).  Consent for procedure obtained. Time Out: Verified patient identification, verified procedure, site/side was marked, verified correct patient position, special equipment/implants available, medications/allergies/relevent history reviewed, required imaging and test results available.  Performed  Maximum sterile technique was used including antiseptics, cap, gloves, hand hygiene and mask.  MAC and 4  4 glide  Evaluation Hemodynamic Status: BP stable throughout; O2 sats: stable throughout Patient's Current Condition: stable Complications: No apparent complications Patient did tolerate procedure well. Chest X-ray ordered to verify placement.  CXR: pending.   Clementeen Graham 11/10/2016

## 2016-11-10 NOTE — Progress Notes (Signed)
eLink Physician-Brief Progress Note Patient Name: Henry LoseJohn R Watts DOB: 09/10/1961 MRN: [id number]   Date of Service  11/10/2016  HPI/Events of Note  Request for ABG.  eICU Interventions  Will order ABG.     Intervention Category Major Interventions: Respiratory failure - evaluation and management  Sommer,Steven Eugene 11/10/2016, 6:52 PM

## 2016-11-10 NOTE — Procedures (Signed)
Arterial Catheter Insertion Procedure Note Henry Watts [id number] 03/13/1961  Procedure: Insertion of Arterial Catheter  Indications: Blood pressure monitoring  Procedure Details Consent: Risks of procedure as well as the alternatives and risks of each were explained to the (patient/caregiver).  Consent for procedure obtained. Time Out: Verified patient identification, verified procedure, site/side was marked, verified correct patient position, special equipment/implants available, medications/allergies/relevent history reviewed, required imaging and test results available.  Performed  Maximum sterile technique was used including antiseptics, cap, gloves, gown, hand hygiene, mask and sheet. Skin prep: Chlorhexidine; local anesthetic administered 20 gauge catheter was inserted into right femoral artery using the Seldinger technique.  Evaluation Blood flow good; BP tracing good. Complications: No apparent complications.   Coralyn HellingVineet Chaynce Schafer, MD [facility] Pulmonary/Critical Care 11/10/2016, 10:51 PM Pager:  [phone] After 3pm call: [phone]

## 2016-11-10 NOTE — Progress Notes (Signed)
PULMONARY / CRITICAL CARE MEDICINE   Name: Henry Watts MRN: [id number] DOB: [birth year]    ADMISSION DATE:  Nov 14, 2016 CONSULTATION DATE:  2016-11-14  REFERRING MD:  [facility]  CHIEF COMPLAINT:  Wheezing and shortness of breath  HISTORY OF PRESENT ILLNESS:   Henry Watts is a 56 y/o man with a PMH of COPD.  He developed shortness of breath and was admitted to the ICU at [facility] on 11/07/16 and placed on BiPap.  He was treated for a presumed LLL pneumonia.  He was weaned off of BiPap and transferred to the floor.  The day of transfer he had an acute worsening of his shortness of breath and a blood gas showed significant respiratory acidosis.  He was placed on BiPap and transfer to [facility] was requested. He was also noted to be hyperkalemic with acute renal failure.     SUBJECTIVE:  Remains SOB but better on Bipap. Remains anuric.    VITAL SIGNS: BP (!) 104/58   Pulse 96   Temp 98.4 F (36.9 C) (Oral)   Resp 12   Ht 5\' 8"  (1.727 m)   Wt 132 kg (291 lb 0.1 oz)   SpO2 98%   BMI 44.25 kg/m   HEMODYNAMICS:    VENTILATOR SETTINGS: FiO2 (%):  [40 %] 40 %  INTAKE / OUTPUT: I/O last 3 completed shifts: In: -  Out: 250 [Urine:250]  PHYSICAL EXAMINATION: General:  Obese male in mild distress, on bipap Neuro:  A&Ox4 HEENT:  PERRL, EOMI, BiPap mask in place Cardiovascular:  Distant heart sounds, NRRR, no MRG Lungs:  Minimal air movement, bilateral scattered wheezes. Crackles at bases.  Abdomen:  Soft, NTND, no palpable HSM Musculoskeletal:  Trace edema Skin:  No rashes, or other lesions  LABS:  BMET  Recent Labs Lab 11/10/16 0205  NA 136  K 6.5*  CL 102  CO2 21*  BUN 157*  CREATININE 4.18*  GLUCOSE 122*    Electrolytes  Recent Labs Lab 11/10/16 0205  CALCIUM 8.4*  MG 2.5*  PHOS 8.8*    CBC  Recent Labs Lab 11/10/16 0205  WBC 18.0*  HGB 9.1*  HCT 28.5*  PLT 285    Coag's No results for input(s): APTT, INR in the last  168 hours.  Sepsis Markers No results for input(s): LATICACIDVEN, PROCALCITON, O2SATVEN in the last 168 hours.  ABG No results for input(s): PHART, PCO2ART, PO2ART in the last 168 hours.  Liver Enzymes  Recent Labs Lab 11/10/16 0205  AST 22  ALT 34  ALKPHOS 31*  BILITOT 0.4  ALBUMIN 2.9*    Cardiac Enzymes No results for input(s): TROPONINI, PROBNP in the last 168 hours.  Glucose  Recent Labs Lab 2016-11-14 2201  GLUCAP 110*    Imaging Dg Chest Port 1 View  Result Date: 11/10/2016 CLINICAL DATA:  Acute onset of cough.  Initial encounter. EXAM: PORTABLE CHEST 1 VIEW COMPARISON:  Chest radiograph performed Nov 14, 2016 FINDINGS: The lungs are well-aerated. Mild left basilar opacity may reflect mild pneumonia. Underlying left basilar atelectasis or scarring is seen. There is no evidence of pleural effusion or pneumothorax. The cardiomediastinal silhouette is within normal limits. No acute osseous abnormalities are seen. IMPRESSION: Mild left basilar opacity may reflect mild pneumonia. Underlying left basilar atelectasis or scarring seen. Electronically Signed   By: Roanna Raider M.D.   On: 11/10/2016 01:33     STUDIES:    CULTURES: MRSA 2/2 > (-) Blood culture and flu (-) at [place]  ANTIBIOT[facility]  SIGNIFICANT EVENTS: 1/31 admit at [facility] for SOB. Rx as PNA 2/[facility] resp fx  LINES/TUBES:   DISCUSSION: 56 y/o man with COPD exacerbation +/- pneumonia, developing renal and resp failure. [facility]     ASSESSMENT / PLAN:  PULMONARY A: Acute on Chronic Hypoxemic Hypercapneic resp fx 2/2 Acute COPD exacerbation +/- LLL PNA +/- Untreated OSA Baseline is on 3 L o2 24/7. Not compliant to cpap.  P:   Cont Bipap for now. 16/10 40% and O2 sats 99%. May need to be intubated.  Methylprednisone at 40 q12 Pulmicort BID and duoneb q6 Check abg   CARDIAC A:  Sinus tachycardia P  : Observe for now.  May need echo   RENAL A:   AKI.  Hyperkalemia P:   Hold all nephrotoxic agents Nephrology consult placed on 2/3 am.  S/p Kayexelate > not making stools  Likely will need HD > await renal.    INFECTIOUS A:   Concern for LLL PNA P:   Hold abx for now.  If develops fever or other signs of infection obtain cultures and treat. [facility] [place] [facility]. Check PCT   NEUROLOGIC  A: Chronic Pain. Anxiety P: He takes Oxycodone 15 mg TID. Holding off for now.  May need precedex.  Avoiding benzos. Allegedly got worse when he got benzos at [place]    ENDOCRINE A: DM P: On sliding scale. Holding off on Metformin  Pt is on Heparin sq for DVT prophylaxis. On H2 blocker for SUP.    FAMILY  - Updates: No family at bedside.   - Inter-disciplinary family meet or Palliative Care meeting due by:  11/16/16    I spent  30  minutes of Critical Care time with this patient today.    Pollie MeyerJ. Angelo A de Dios, MD 11/10/2016, 7:33 AM [place] [facility] Pager [phone] After 3 pm or if no answer, call [phone]

## 2016-11-10 NOTE — Progress Notes (Signed)
   [facility] PCCM  Pt appears volume depleted.  Has received 1L bolus this pm when BP dropped with intubation and sedation.  Pt is on levophed.  On CRRT, keeping even.   BP 80/60, HR  80  Plan : Bolus 1 L NS Cont levophed.  Cont vasopressin Add neosynpehrine.  If still hypotensive despite neo, will switch neo to epi drip.   Pollie MeyerJ. Angelo A de Dios, MD 11/10/2016, 5:38 PM [place] [facility] Pager [phone] After 3 pm or if no answer, call [phone]

## 2016-11-10 NOTE — Consult Note (Signed)
Reason for Consult: Nephrology Referring Physician: Corrie Dandy  Henry Watts is an 56 y.o. male with PMH of HTN, COPD GOLD II, DM2, Tobacco Use, OSA transferred from [place] with acute respiratory failure with AKI and hyperkalemia.   HPI:   Henry Watts initially presented to [facility] for shortness of breath and cough. He was noted to be tachycardic with stable BP but hypoxic and was paced on BiPap for acute respiratory failure likely due to COPD exacerbation with HCAP. He initially received Vancomycin which according to notes was stopped in less than 24 hours. He was then started on Levaquin and Aztreonam for sepsis with HCAP. His Lisinopril and Metformin were continued during hospitalization per chart review. His symptoms improved and he was initially transferred out of ICU. On 2/2 he became hypotensive to 60s and per chart and was not symptomatic but had worsening acidosis. His creatinine started to increase on 2/2 to 2.9 (from 1.1 on 2/1) with elevated BUN. He received fluids for hypotension but Creatinine continued to rise along with BUN. On 2/2 @0400  Cr 2.9 BUN 123 to 2/2 1355 Cr 3.9 BUN 141. Had renal US done at [place] unremarkable. UA completed showed 1+ protein and hyaline casts otherwise unremarkable.   Given Kayexleate and did have BM this morning. CCM holding antibiotics for now. CCM planning on intubation. Lactic acid 2.3 and procalcitonin 1.32.  Patient on Bipap and able to answer questions. He reports of shortness of breath and some chest pain because of the work of breathing he reports. Chest pain has been present since he first became dyspneic. No abdominal pain. Denies issues with urination in the past.   Past Medical History:  Diagnosis Date  . COPD (chronic obstructive pulmonary disease) ([place])   . OSA (obstructive sleep apnea)    not on CPAP- can not tolerate    Past Surgical History:  Procedure Laterality Date  . CARDIAC CATHETERIZATION     2004 and 2008     Family  History  Problem Relation Age of Onset  . Emphysema Mother     smoked  . Asthma Mother   . Heart disease Father     Social History:  reports that he has been smoking Cigarettes.  He has a 30.00 pack-year smoking history. He has never used smokeless tobacco. He reports that he does not drink alcohol or use drugs.  Allergies:  Allergies  Allergen Reactions  . Penicillins Hives    Results for orders placed or performed during the hospital encounter of 11/30/2016 (from the past 48 hour(s))  MRSA PCR Screening     Status: None   Collection Time: 11/15/2016  9:55 PM  Result Value Ref Range   MRSA by PCR NEGATIVE NEGATIVE    Comment:        The GeneXpert MRSA Assay (FDA approved for NASAL specimens only), is one component of a comprehensive MRSA colonization surveillance program. It is not intended to diagnose MRSA infection nor to guide or monitor treatment for MRSA infections.   Glucose, capillary     Status: Abnormal   Collection Time: 12/01/2016 10:01 PM  Result Value Ref Range   Glucose-Capillary 110 (H) 65 - 99 mg/dL   Comment 1 Notify RN   Comprehensive metabolic panel     Status: Abnormal   Collection Time: 11/10/16  2:05 AM  Result Value Ref Range   Sodium 136 135 - 145 mmol/L   Potassium 6.5 (HH) 3.5 - 5.1 mmol/L    Comment: NO VISIBLE HEMOLYSIS  CRITICAL RESULT CALLED TO, READ BACK BY AND VERIFIED WITH: MOTLEY J,RN 11/10/16 0313 WAYK    Chloride 102 101 - 111 mmol/L   CO2 21 (L) 22 - 32 mmol/L   Glucose, Bld 122 (H) 65 - 99 mg/dL   BUN 157 (H) 6 - 20 mg/dL   Creatinine, Ser 4.18 (H) 0.61 - 1.24 mg/dL   Calcium 8.4 (L) 8.9 - 10.3 mg/dL   Total Protein 5.3 (L) 6.5 - 8.1 g/dL   Albumin 2.9 (L) 3.5 - 5.0 g/dL   AST 22 15 - 41 U/L   ALT 34 17 - 63 U/L   Alkaline Phosphatase 31 (L) 38 - 126 U/L   Total Bilirubin 0.4 0.3 - 1.2 mg/dL   GFR calc non Af Amer 15 (L) >60 mL/min   GFR calc Af Amer 17 (L) >60 mL/min    Comment: (NOTE) The eGFR has been calculated using the  CKD EPI equation. This calculation has not been validated in all clinical situations. eGFR's persistently <60 mL/min signify possible Chronic Kidney Disease.    Anion gap 13 5 - 15  Magnesium     Status: Abnormal   Collection Time: 11/10/16  2:05 AM  Result Value Ref Range   Magnesium 2.5 (H) 1.7 - 2.4 mg/dL  Phosphorus     Status: Abnormal   Collection Time: 11/10/16  2:05 AM  Result Value Ref Range   Phosphorus 8.8 (H) 2.5 - 4.6 mg/dL  CBC WITH DIFFERENTIAL     Status: Abnormal   Collection Time: 11/10/16  2:05 AM  Result Value Ref Range   WBC 18.0 (H) 4.0 - 10.5 K/uL   RBC 3.18 (L) 4.22 - 5.81 MIL/uL   Hemoglobin 9.1 (L) 13.0 - 17.0 g/dL   HCT 28.5 (L) 39.0 - 52.0 %   MCV 89.6 78.0 - 100.0 fL   MCH 28.6 26.0 - 34.0 pg   MCHC 31.9 30.0 - 36.0 g/dL   RDW 14.7 11.5 - 15.5 %   Platelets 285 150 - 400 K/uL   Neutrophils Relative % 90 %   Neutro Abs 16.3 (H) 1.7 - 7.7 K/uL   Lymphocytes Relative 7 %   Lymphs Abs 1.2 0.7 - 4.0 K/uL   Monocytes Relative 3 %   Monocytes Absolute 0.5 0.1 - 1.0 K/uL   Eosinophils Relative 0 %   Eosinophils Absolute 0.0 0.0 - 0.7 K/uL   Basophils Relative 0 %   Basophils Absolute 0.0 0.0 - 0.1 K/uL    Dg Chest Port 1 View  Result Date: 11/10/2016 CLINICAL DATA:  Acute onset of cough.  Initial encounter. EXAM: PORTABLE CHEST 1 VIEW COMPARISON:  Chest radiograph performed 11/27/2016 FINDINGS: The lungs are well-aerated. Mild left basilar opacity may reflect mild pneumonia. Underlying left basilar atelectasis or scarring is seen. There is no evidence of pleural effusion or pneumothorax. The cardiomediastinal silhouette is within normal limits. No acute osseous abnormalities are seen. IMPRESSION: Mild left basilar opacity may reflect mild pneumonia. Underlying left basilar atelectasis or scarring seen. Electronically Signed   By: Garald Balding M.D.   On: 11/10/2016 01:33    Review of Systems  Constitutional: Negative for chills and fever.   Respiratory: Positive for cough and shortness of breath.   Gastrointestinal: Negative for abdominal pain, diarrhea, nausea and vomiting.  Genitourinary: Negative for dysuria, frequency and urgency.   Blood pressure (!) 104/58, pulse 96, temperature 98 F (36.7 C), temperature source Axillary, resp. rate 12, height 5' 8"  (1.727 m), weight 132  kg (291 lb 0.1 oz), SpO2 98 %. Physical Exam   Physical Examination:  General appearance - obese, alert, on bipap, increased work of breathing  Entub now, sedated,not responsive Mental status - alert, able to answer questions  Heart - normal rate, regular rhythm, normal S1, S2, no murmurs, rubs, clicks or gallops Gr 2/6 holosys M Lungs - on bipap with increased work of breathing, diminished lung sounds at the bases anteriorly  Abdomen - soft, nontender, nondistended, no masses or organomegaly Liver down6 cm Extremities: no LE swelling   Assessment/Plan: Henry Watts is a 56yo male with PMH COPD who presented with acute respiratory failure, hypotension with AKI and hyperkalemia. Respiratory failure thought to be due to COPD exacerbation +/- HCAP. AKI with Uremia and Hyperkalemia likely due to his hypotension in the setting of ACE inhibitor use/Metformin.   AKI with Uremia and Hyperkalemia: likely due to his hypotension in the setting of ACE inhibitor use/Metformin. Renal US unremarkable from [place]. - plan for CRRT in the setting of low blood pressures - urinalysis, Urine Na and Urine Creatinine Has received a lot of vol, willneed to get back but not now.  This appears hemodynamic with low bp in the face of ACEI  Hyperkalemia:  - per CCM, patient received calcium gluconate and kayexelate. BM this morning - note above. Lower with CRRT  COPD with Acute Respiratory Failure:  CCM planning on intubation.  Hypotension: possibly due to sepsis vs cardiac event?. Per primary team  OSA History of Hypertension DM2 Massive obesity Holding Metformin  contrib to ^ lactate  Smiley Houseman 11/10/2016, 8:12 AM  I have seen and examined this patient and agree with the plan of care seen, eval, examined, discussed with resident, primary, staff, and orders written .  Armstrong Creasy L 11/10/2016, 11:59 AM

## 2016-11-10 NOTE — Progress Notes (Signed)
   [facility] PCCM  Spoke and updated pt's wife re: pts over all condition.  Mentioned to her re: HD  And possible intubation.   Pollie MeyerJ. Angelo A de Dios, MD 11/10/2016, 7:48 AM [place] [facility] Pager [phone] After 3 pm or if no answer, call [phone]

## 2016-11-10 NOTE — Procedures (Signed)
Central Venous dialysis Catheter Insertion Procedure Note Henry LoseJohn R Trethewey [id number] 08/08/1961  Procedure: Insertion of Central Venous Catheter Indications: emergent dialysis   Procedure Details Consent: Risks of procedure as well as the alternatives and risks of each were explained to the (patient/caregiver).  Consent for procedure obtained. Time Out: Verified patient identification, verified procedure, site/side was marked, verified correct patient position, special equipment/implants available, medications/allergies/relevent history reviewed, required imaging and test results available.  Performed Real time US used to ID and cannulate vessel  Maximum sterile technique was used including antiseptics, cap, gloves, gown, hand hygiene, mask and sheet. Skin prep: Chlorhexidine; local anesthetic administered A antimicrobial bonded/coated triple lumen catheter was placed in the right internal jugular vein using the Seldinger technique.  Evaluation Blood flow good Complications: No apparent complications Patient did tolerate procedure well. Chest X-ray ordered to verify placement.  CXR: pending.  Shelby Mattocksete E Janie Strothman 11/10/2016, 12:44 PM  Simonne MartinetPeter E Mareo Portilla ACNP-BC [facility] Pager # [phone] OR # [phone] if no answer

## 2016-11-11 ENCOUNTER — Inpatient Hospital Stay (HOSPITAL_COMMUNITY): Payer: Medicare Other

## 2016-11-11 DIAGNOSIS — J189 Pneumonia, unspecified organism: Secondary | ICD-10-CM

## 2016-11-11 LAB — RESPIRATORY PANEL BY PCR
Adenovirus: NOT DETECTED
BORDETELLA PERTUSSIS-RVPCR: NOT DETECTED
CORONAVIRUS 229E-RVPPCR: NOT DETECTED
CORONAVIRUS OC43-RVPPCR: NOT DETECTED
Chlamydophila pneumoniae: NOT DETECTED
Coronavirus HKU1: NOT DETECTED
Coronavirus NL63: NOT DETECTED
INFLUENZA A H3-RVPPCR: DETECTED — AB
INFLUENZA B-RVPPCR: NOT DETECTED
METAPNEUMOVIRUS-RVPPCR: NOT DETECTED
Mycoplasma pneumoniae: NOT DETECTED
PARAINFLUENZA VIRUS 2-RVPPCR: NOT DETECTED
PARAINFLUENZA VIRUS 4-RVPPCR: NOT DETECTED
Parainfluenza Virus 1: NOT DETECTED
Parainfluenza Virus 3: NOT DETECTED
RESPIRATORY SYNCYTIAL VIRUS-RVPPCR: NOT DETECTED
Rhinovirus / Enterovirus: NOT DETECTED

## 2016-11-11 LAB — CBC
HCT: 28.1 % — ABNORMAL LOW (ref 39.0–52.0)
Hemoglobin: 9.1 g/dL — ABNORMAL LOW (ref 13.0–17.0)
MCH: 28.6 pg (ref 26.0–34.0)
MCHC: 32.4 g/dL (ref 30.0–36.0)
MCV: 88.4 fL (ref 78.0–100.0)
PLATELETS: 242 10*3/uL (ref 150–400)
RBC: 3.18 MIL/uL — ABNORMAL LOW (ref 4.22–5.81)
RDW: 15 % (ref 11.5–15.5)
WBC: 27.6 10*3/uL — AB (ref 4.0–10.5)

## 2016-11-11 LAB — RENAL FUNCTION PANEL
ALBUMIN: 2.7 g/dL — AB (ref 3.5–5.0)
ANION GAP: 10 (ref 5–15)
Albumin: 2.7 g/dL — ABNORMAL LOW (ref 3.5–5.0)
Anion gap: 12 (ref 5–15)
BUN: 73 mg/dL — ABNORMAL HIGH (ref 6–20)
BUN: 46 mg/dL — AB (ref 6–20)
CALCIUM: 7.6 mg/dL — AB (ref 8.9–10.3)
CALCIUM: 7.4 mg/dL — AB (ref 8.9–10.3)
CO2: 23 mmol/L (ref 22–32)
CO2: 23 mmol/L (ref 22–32)
CREATININE: 2.34 mg/dL — AB (ref 0.61–1.24)
Chloride: 102 mmol/L (ref 101–111)
Chloride: 98 mmol/L — ABNORMAL LOW (ref 101–111)
Creatinine, Ser: 2.9 mg/dL — ABNORMAL HIGH (ref 0.61–1.24)
GFR calc Af Amer: 27 mL/min — ABNORMAL LOW (ref >60)
GFR calc Af Amer: 35 mL/min — ABNORMAL LOW (ref >60)
GFR calc non Af Amer: 23 mL/min — ABNORMAL LOW (ref >60)
GFR, EST NON AFRICAN AMERICAN: 30 mL/min — AB (ref >60)
GLUCOSE: 133 mg/dL — AB (ref 65–99)
GLUCOSE: 203 mg/dL — AB (ref 65–99)
PHOSPHORUS: 4.9 mg/dL — AB (ref 2.5–4.6)
POTASSIUM: 5.4 mmol/L — AB (ref 3.5–5.1)
POTASSIUM: 5.1 mmol/L (ref 3.5–5.1)
Phosphorus: 5.9 mg/dL — ABNORMAL HIGH (ref 2.5–4.6)
SODIUM: 135 mmol/L (ref 135–145)
SODIUM: 133 mmol/L — AB (ref 135–145)

## 2016-11-11 LAB — GLUCOSE, CAPILLARY
GLUCOSE-CAPILLARY: 169 mg/dL — AB (ref 65–99)
Glucose-Capillary: 130 mg/dL — ABNORMAL HIGH (ref 65–99)
Glucose-Capillary: 125 mg/dL — ABNORMAL HIGH (ref 65–99)
Glucose-Capillary: 137 mg/dL — ABNORMAL HIGH (ref 65–99)
Glucose-Capillary: 190 mg/dL — ABNORMAL HIGH (ref 65–99)
Glucose-Capillary: 164 mg/dL — ABNORMAL HIGH (ref 65–99)

## 2016-11-11 LAB — POCT I-STAT 3, ART BLOOD GAS (G3+)
ACID-BASE DEFICIT: 5 mmol/L — AB (ref 0.0–2.0)
Bicarbonate: 23.9 mmol/L (ref 20.0–28.0)
O2 Saturation: 98 %
TCO2: 26 mmol/L (ref 0–100)
pCO2 arterial: 60.1 mmHg — ABNORMAL HIGH (ref 32.0–48.0)
pH, Arterial: 7.205 — ABNORMAL LOW (ref 7.350–7.450)
pO2, Arterial: 123 mmHg — ABNORMAL HIGH (ref 83.0–108.0)

## 2016-11-11 LAB — POCT ACTIVATED CLOTTING TIME
ACTIVATED CLOTTING TIME: 158 s
ACTIVATED CLOTTING TIME: 164 s
ACTIVATED CLOTTING TIME: 169 s
ACTIVATED CLOTTING TIME: 197 s
ACTIVATED CLOTTING TIME: 197 s
ACTIVATED CLOTTING TIME: 197 s
ACTIVATED CLOTTING TIME: 197 s
Activated Clotting Time: 158 seconds
Activated Clotting Time: 164 seconds
Activated Clotting Time: 169 seconds
Activated Clotting Time: 169 seconds
Activated Clotting Time: 191 seconds
Activated Clotting Time: 191 seconds

## 2016-11-11 LAB — MAGNESIUM: Magnesium: 2.5 mg/dL — ABNORMAL HIGH (ref 1.7–2.4)

## 2016-11-11 LAB — APTT: aPTT: 101 seconds — ABNORMAL HIGH (ref 24–36)

## 2016-11-11 LAB — PROCALCITONIN: Procalcitonin: 2.25 ng/mL

## 2016-11-11 MED ORDER — VANCOMYCIN HCL IN DEXTROSE 1-5 GM/200ML-% IV SOLN
1000.0000 mg | INTRAVENOUS | Status: DC
Start: 2016-11-12 — End: 2016-11-11

## 2016-11-11 MED ORDER — HEPARIN BOLUS VIA INFUSION (CRRT)
5000.0000 [IU] | Freq: Once | INTRAVENOUS | Status: AC
  Administered 2016-11-11: 5000 [IU] via INTRAVENOUS_CENTRAL
  Filled 2016-11-11: qty 5000

## 2016-11-11 MED ORDER — "THROMBI-PAD 3""X3"" EX PADS"
1.0000 | MEDICATED_PAD | Freq: Once | CUTANEOUS | Status: AC
  Administered 2016-11-11: 1 via TOPICAL
  Filled 2016-11-11 (×3): qty 1

## 2016-11-11 MED ORDER — MEPERIDINE HCL 25 MG/ML IJ SOLN
12.5000 mg | Freq: Once | INTRAMUSCULAR | Status: AC
  Administered 2016-11-11: 12.5 mg via INTRAVENOUS
  Filled 2016-11-11 (×2): qty 1

## 2016-11-11 MED ORDER — MEROPENEM 1 G IV SOLR
1.0000 g | Freq: Two times a day (BID) | INTRAVENOUS | Status: DC
  Administered 2016-11-11 – 2016-11-13 (×5): 1 g via INTRAVENOUS
  Filled 2016-11-11 (×6): qty 1

## 2016-11-11 MED ORDER — SODIUM CHLORIDE 0.9 % IV SOLN
2000.0000 mg | Freq: Once | INTRAVENOUS | Status: AC
  Administered 2016-11-11: 2000 mg via INTRAVENOUS
  Filled 2016-11-11: qty 2000

## 2016-11-11 MED ORDER — "THROMBI-PAD 3""X3"" EX PADS"
1.0000 | MEDICATED_PAD | Freq: Once | CUTANEOUS | Status: AC
  Administered 2016-11-11: 1 via TOPICAL
  Filled 2016-11-11: qty 1

## 2016-11-11 MED ORDER — HEPARIN SODIUM (PORCINE) 5000 UNIT/ML IJ SOLN
5000.0000 [IU] | Freq: Three times a day (TID) | INTRAMUSCULAR | Status: DC
  Administered 2016-11-11: 5000 [IU] via SUBCUTANEOUS
  Filled 2016-11-11 (×2): qty 1

## 2016-11-11 MED ORDER — SODIUM CHLORIDE 0.9 % IV SOLN
1250.0000 mg | INTRAVENOUS | Status: DC
  Administered 2016-11-12 – 2016-11-13 (×2): 1250 mg via INTRAVENOUS
  Filled 2016-11-11 (×2): qty 1250

## 2016-11-11 MED ORDER — HEPARIN BOLUS VIA INFUSION: 5000.0000 [IU] | Freq: Once | INTRAVENOUS | Status: DC

## 2016-11-11 MED ORDER — OSELTAMIVIR PHOSPHATE 6 MG/ML PO SUSR
30.0000 mg | ORAL | Status: DC
  Administered 2016-11-11: 30 mg via ORAL
  Filled 2016-11-11 (×3): qty 5

## 2016-11-11 NOTE — Progress Notes (Signed)
Subjective: Interval History: bp more stable, some prob with hep.  Objective: Vital signs in last 24 hours: Temp:  [97.4 F (36.3 C)-98.4 F (36.9 C)] 98.3 F (36.8 C) (02/04 0744) Pulse Rate:  [25-121] 25 (02/04 0330) Resp:  [9-30] 30 (02/04 0730) BP: (55-103)/(30-89) 74/45 (02/04 0700) SpO2:  [88 %-100 %] 90 % (02/04 0730) Arterial Line BP: (79-124)/(31-57) 100/50 (02/04 0730) FiO2 (%):  [40 %-60 %] 50 % (02/04 0730) Weight:  [135.4 kg (298 lb 8.1 oz)] 135.4 kg (298 lb 8.1 oz) (02/04 0430) Weight change: 3.358 kg (7 lb 6.5 oz)  Intake/Output from previous day: 02/03 0701 - 02/04 0700 In: 4245.1 [I.V.:2245.1; IV Piggyback:2000] Out: 1769 [Urine:155] Intake/Output this shift: No intake/output data recorded.  General appearance: sedated on vent Neck: RIJ cath Resp: RIJ cath  Cardio: S1, S2 normal and systolic murmur: systolic ejection 2/6, blowing at apex GI: obese, distended, pos bs.   Extremities: edema 2+  Lab Results:  Recent Labs  11/10/16 0205 11/11/16 0409  WBC 18.0* 27.6*  HGB 9.1* 9.1*  HCT 28.5* 28.1*  PLT 285 242   BMET:  Recent Labs  11/10/16 1555 11/11/16 0409  NA 135 135  K 5.1 5.4*  CL 102 102  CO2 23 23  GLUCOSE 231* 133*  BUN 145* 73*  CREATININE 3.97* 2.90*  CALCIUM 7.6* 7.6*   No results for input(s): PTH in the last 72 hours. Iron Studies: No results for input(s): IRON, TIBC, TRANSFERRIN, FERRITIN in the last 72 hours.  Studies/Results: Dg Chest Port 1 View  Result Date: 11/11/2016 CLINICAL DATA:  Dyspnea. EXAM: PORTABLE CHEST 1 VIEW COMPARISON:  Yesterday at 1030 hour, additional priors FINDINGS: Endotracheal tube 7 cm from the carina. Enteric tube in place, tip below the diaphragm. Tip of the right central line projects near the distal brachiocephalic vein, unchanged. Volume loss in the right lung with right basilar opacity and probable effusion, unchanged from prior exam. Left lung base atelectasis is again seen. Unchanged heart size  and mediastinal contours. Scattered calcified granulomas. IMPRESSION: 1. Support apparatus unchanged. 2. Unchanged right lung volume loss and right basilar opacity. While this may be due to atelectasis and pleural effusion, chest CT may be helpful to exclude central pulmonary mass. Electronically Signed   By: Rubye Oaks M.D.   On: 11/11/2016 05:26   Dg Chest Port 1 View  Result Date: 11/10/2016 CLINICAL DATA:  Central line placement. EXAM: PORTABLE CHEST 1 VIEW COMPARISON:  November 10, 2016 FINDINGS: An ET tube has been placed in the interval in good position. A new right central line terminates near the brachiocephalic confluence. No pneumothorax. There is a small right pleural effusion with underlying atelectasis. Mild atelectasis in the left base. No other interval changes. IMPRESSION: Support apparatus placement as above.  No pneumothorax. Small right pleural effusion with underlying atelectasis. Electronically Signed   By: Gerome Sam III M.D   On: 11/10/2016 11:18   Dg Chest Port 1 View  Result Date: 11/10/2016 CLINICAL DATA:  Acute onset of cough.  Initial encounter. EXAM: PORTABLE CHEST 1 VIEW COMPARISON:  Chest radiograph performed 11/23/16 FINDINGS: The lungs are well-aerated. Mild left basilar opacity may reflect mild pneumonia. Underlying left basilar atelectasis or scarring is seen. There is no evidence of pleural effusion or pneumothorax. The cardiomediastinal silhouette is within normal limits. No acute osseous abnormalities are seen. IMPRESSION: Mild left basilar opacity may reflect mild pneumonia. Underlying left basilar atelectasis or scarring seen. Electronically Signed   By: Leotis Shames  Chang M.D.   On: 11/10/2016 01:33   Dg Abd Portable 1v  Result Date: 11/10/2016 CLINICAL DATA:  OG tube placement EXAM: PORTABLE ABDOMEN - 1 VIEW COMPARISON:  None. FINDINGS: Right pleural effusion with underlying atelectasis. An OG tube terminates in the right side of the abdomen, likely in the  distal stomach or proximal duodenum. IMPRESSION: OG tube placement as above. I suspect the distal tip may be in the proximal duodenum. The patient may benefit from withdrawing several cm. Electronically Signed   By: Gerome Samavid  Williams III M.D   On: 11/10/2016 11:19    I have reviewed the patient's current medications.  Assessment/Plan: 1 AKI oliguric ATN, Good solute/acid/base. Will keep even as bp better.  Tomorrow take vol off. K still ^ but better, follow.  Will give extra hep as clotted yest 2 COPD per CCM 3 VDRF  4 obesity 5 anemia stable 6 Poss pneu P CRRT, bolus hep, even, vent, AB     LOS: 2 days   Marilin Kofman L 11/11/2016,8:01 AM

## 2016-11-11 NOTE — Progress Notes (Signed)
Pharmacy Antibiotic Note  [name] is a 56 y.o. male admitted on 11/22/2016 with VDRF and AKI on CRRT, now with leukocytosis and possible sepsis.  Pharmacy has been consulted for Vancomycin and Merrem dosing.  Plan: Vancomycin 2 g IV now, then 1 g IV q24h Meropenem 1 g IV q12h F/U renal function and CRRT   Height: 5\' 8"  (172.7 cm) Weight: 298 lb 8.1 oz (135.4 kg) IBW/kg (Calculated) : 68.4  Temp (24hrs), Avg:98 F (36.7 C), Min:97.4 F (36.3 C), Max:98.4 F (36.9 C)   Recent Labs Lab 11/10/16 0205 11/10/16 0735 11/10/16 1555 11/10/16 2308 11/11/16 0409  WBC 18.0*  --   --   --  27.6*  CREATININE 4.18* 4.11* 3.97*  --  2.90*  LATICACIDVEN  --  2.3*  --  1.8  --     Estimated Creatinine Clearance: 39.2 mL/min (by C-G formula based on SCr of 2.9 mg/dL (H)).    Allergies  Allergen Reactions  . Penicillins Hives    Has patient had a PCN reaction causing immediate rash, facial/tongue/throat swelling, SOB or lightheadedness with hypotension: Yes Has patient had a PCN reaction causing severe rash involving mucus membranes or skin necrosis: No Has patient had a PCN reaction that required hospitalization unknown Has patient had a PCN reaction occurring within the last 10 years: No If all of the above answers are "NO", then may proceed with Cephalosporin use.    Henry Watts, Henry Watts 11/11/2016 7:13 AM

## 2016-11-11 NOTE — Progress Notes (Signed)
eLink Physician-Brief Progress Note Patient Name: Henry LoseJohn R Munns DOB: 10/09/1960 MRN: [id number]   Date of Service  11/11/2016  HPI/Events of Note  Nasal swab is positive for Influenza A. Patient is on CRRT. Discussed with Dr. Christene Slatese Dios, he is in favor of starting Tamiflu Rx.   eICU Interventions  Will order: 1. Tamiflu Suspension 30 mg via tube now and Q day X 4 days (Total = 5 doses).     Intervention Category Major Interventions: Infection - evaluation and management  Ewald Beg Eugene 11/11/2016, 3:56 PM

## 2016-11-11 NOTE — Progress Notes (Signed)
PULMONARY / CRITICAL CARE MEDICINE   Name: Henry Watts MRN: [id number] DOB: Nov 03, 1960    ADMISSION DATE:  2016/11/14 CONSULTATION DATE:  11-14-16  REFERRING MD:  [facility]  CHIEF COMPLAINT:  Wheezing and shortness of breath  HISTORY OF PRESENT ILLNESS:   Henry Watts is a 56 y/o man with a PMH of COPD.  He developed shortness of breath and was admitted to the ICU at [facility] on 11/07/16 and placed on BiPap.  He was treated for a presumed LLL pneumonia.  He was weaned off of BiPap and transferred to the floor.  The day of transfer he had an acute worsening of his shortness of breath and a blood gas showed significant respiratory acidosis.  He was placed on BiPap and transfer to [facility] was requested. He was also noted to be hyperkalemic with acute renal failure.     SUBJECTIVE:  Intubated. Breath stacking at times.  On pressors but better.  Fem art BP  Is better. Cuff BP is low.  (+) chills, shaking.  Tolerating CVVH.   VITAL SIGNS: BP (!) 98/40   Pulse (!) 110   Temp 98.3 F (36.8 C) (Oral)   Resp (!) 24   Ht 5\' 8"  (1.727 m)   Wt 135.4 kg (298 lb 8.1 oz)   SpO2 97%   BMI 45.39 kg/m   HEMODYNAMICS:    VENTILATOR SETTINGS: Vent Mode: PRVC FiO2 (%):  [50 %-60 %] 50 % Set Rate:  [20 bmp-30 bmp] 30 bmp Vt Set:  [550 mL] 550 mL PEEP:  [5 cmH20] 5 cmH20 Plateau Pressure:  [15 cmH20-31 cmH20] 28 cmH20  INTAKE / OUTPUT: I/O last 3 completed shifts: In: 4245.1 [I.V.:2245.1; IV Piggyback:2000] Out: 2019 [Urine:405; Other:1614]  PHYSICAL EXAMINATION: General:  Obese male in mild distress and vent dyssynchrony at times. Intubated. Sedated.  Neuro:  CN grossly intact. (-) lateralizing signs.  HEENT:  PERRL, EOMI, BiPap mask in place Cardiovascular:  Distant heart sounds, NRRR, no MRG Lungs:  Minimal air movement, bilateral scattered wheezes. Crackles at bases.  Abdomen:  Soft, NTND, no palpable HSM Musculoskeletal:  Gr 1 edema Skin:  No rashes, or  other lesions  LABS:  BMET  Recent Labs Lab 11/10/16 0735 11/10/16 1555 11/11/16 0409  NA 137 135 135  K 6.4* 5.1 5.4*  CL 101 102 102  CO2 23 23 23   BUN 168* 145* 73*  CREATININE 4.11* 3.97* 2.90*  GLUCOSE 136* 231* 133*    Electrolytes  Recent Labs Lab 11/10/16 0205 11/10/16 0735 11/10/16 1555 11/11/16 0409  CALCIUM 8.4* 8.5* 7.6* 7.6*  MG 2.5*  --   --  2.5*  PHOS 8.8*  --  7.7* 5.9*    CBC  Recent Labs Lab 11/10/16 0205 11/11/16 0409  WBC 18.0* 27.6*  HGB 9.1* 9.1*  HCT 28.5* 28.1*  PLT 285 242    Coag's  Recent Labs Lab 11/10/16 0735 11/11/16 0409  APTT 25 101*  INR 1.12  --     Sepsis Markers  Recent Labs Lab 11/10/16 0735 11/10/16 2308 11/11/16 0409  LATICACIDVEN 2.3* 1.8  --   PROCALCITON 1.32  --  2.25    ABG  Recent Labs Lab 11/10/16 1312 11/10/16 2322 11/11/16 0429  PHART 7.151* 7.160* 7.205*  PCO2ART 62.3* 68.8* 60.1*  PO2ART 115* 127.0* 123.0*    Liver Enzymes  Recent Labs Lab 11/10/16 0205 11/10/16 1555 11/11/16 0409  AST 22  --   --   ALT 34  --   --  ALKPHOS 31*  --   --   BILITOT 0.4  --   --   ALBUMIN 2.9* 2.6* 2.7*    Cardiac Enzymes No results for input(s): TROPONINI, PROBNP in the last 168 hours.  Glucose  Recent Labs Lab 11/10/16 1203 11/10/16 1605 11/10/16 1955 11/11/16 0011 11/11/16 0423 11/11/16 0746  GLUCAP 123* 217* 190* 169* 130* 125*    Imaging Dg Chest Port 1 View  Result Date: 11/11/2016 CLINICAL DATA:  Dyspnea. EXAM: PORTABLE CHEST 1 VIEW COMPARISON:  Yesterday at 1030 hour, additional priors FINDINGS: Endotracheal tube 7 cm from the carina. Enteric tube in place, tip below the diaphragm. Tip of the right central line projects near the distal brachiocephalic vein, unchanged. Volume loss in the right lung with right basilar opacity and probable effusion, unchanged from prior exam. Left lung base atelectasis is again seen. Unchanged heart size and mediastinal contours.  Scattered calcified granulomas. IMPRESSION: 1. Support apparatus unchanged. 2. Unchanged right lung volume loss and right basilar opacity. While this may be due to atelectasis and pleural effusion, chest CT may be helpful to exclude central pulmonary mass. Electronically Signed   By: Rubye OaksMelanie  Ehinger M.D.   On: 11/11/2016 05:26   Dg Chest Port 1 View  Result Date: 11/10/2016 CLINICAL DATA:  Central line placement. EXAM: PORTABLE CHEST 1 VIEW COMPARISON:  November 10, 2016 FINDINGS: An ET tube has been placed in the interval in good position. A new right central line terminates near the brachiocephalic confluence. No pneumothorax. There is a small right pleural effusion with underlying atelectasis. Mild atelectasis in the left base. No other interval changes. IMPRESSION: Support apparatus placement as above.  No pneumothorax. Small right pleural effusion with underlying atelectasis. Electronically Signed   By: Gerome Samavid  Williams III M.D   On: 11/10/2016 11:18   Dg Abd Portable 1v  Result Date: 11/10/2016 CLINICAL DATA:  OG tube placement EXAM: PORTABLE ABDOMEN - 1 VIEW COMPARISON:  None. FINDINGS: Right pleural effusion with underlying atelectasis. An OG tube terminates in the right side of the abdomen, likely in the distal stomach or proximal duodenum. IMPRESSION: OG tube placement as above. I suspect the distal tip may be in the proximal duodenum. The patient may benefit from withdrawing several cm. Electronically Signed   By: Gerome Samavid  Williams III M.D   On: 11/10/2016 11:19     STUDIES:    CULTURES: MRSA 2/2 > (-) Blood culture and flu (-) at [facility] RSV 2/4 >> Blood 2/4 >> Trache 2/4 >>    ANTIBIOTICS: [facility] > Meropenem 2/4 >  SIGNIFICANT EVENTS: 1/31 admit at [facility] for SOB. Rx as PNA 2/[facility] resp fx  LINES/TUBES:   DISCUSSION: 56 y/o man with COPD exacerbation +/- pneumonia, developing renal and resp failure. [facility]. Intubated. On CVVH.     ASSESSMENT / PLAN:  PULMONARY A: Acute on Chronic Hypoxemic Hypercapneic resp fx 2/2 Acute COPD exacerbation +/- LLL PNA +/- Untreated OSA Baseline is on 3 L o2 24/7. Not compliant to cpap.  P:   Cont vent support.  Wean as able.  Needs fluid off.  CXR with RLL infiltrate/effusion. Not sure if its just atelectasis. Will observe. May need chest ct scan.  Methylprednisone at 40 q12  Pulmicort BID and duoneb q6 ABG acceptable ( rate 30, TV 500)   CARDIAC A:  Sinus tachycardia Volume overload P : Volume off per cvvh May need echo  RENAL A:   AKI P:   Hold all nephrotoxic agents Cont cvvh  INFECTIOUS A:   Concern for RLL PNA. Flu (-) Behaving like he is septic with hypotension, fevers, pressors. Not sure if its just  PNA.  P:   Will start vanc and meropenem (was on vanc and levaquin at another hospital) Panculture. I still want to R/O influenza again. He might have been falsely (-) when he came in.    NEUROLOGIC  A: Chronic Pain. Anxiety P: He takes Oxycodone 15 mg TID. Holding off for now.  Cont fentanyl drip and versed drip.    ENDOCRINE A: DM P: On sliding scale. Holding off on Metformin  Pt is on Heparin sq for DVT prophylaxis. On H2 blocker for SUP.    FAMILY  - Updates: No family at bedside. Family updated extensively on 2/3.   - Inter-disciplinary family meet or Palliative Care meeting due by:  11/16/16    I spent  30  minutes of Critical Care time with this patient today.    Pollie Meyer, MD 11/11/2016, 10:20 AM [place] [facility] Pager [phone] After 3 pm or if no answer, call [phone]

## 2016-11-11 NOTE — Progress Notes (Signed)
Pharmacy Antibiotic Note  Henry Watts is a 56 y.o. male admitted on 11/19/2016 with VDRF and AKI on CRRT, now with leukocytosis and possible sepsis.  Pharmacy has been consulted for Vancomycin and Merrem dosing.  Given the patient's weight of 135 kg - will adjust the Vancomycin maintenance doses slightly while on CRRT to 1250 mg every 24 hours.  Plan: 1. Adjust Vancomycin to 1250 mg IV every 24 hours 2. Continue Meropenem 1g IV every 12 hours 3. Will continue to follow renal function, culture results, LOT, and antibiotic de-escalation plans    Height: 5\' 8"  (172.7 cm) Weight: 298 lb 8.1 oz (135.4 kg) IBW/kg (Calculated) : 68.4  Temp (24hrs), Avg:98 F (36.7 C), Min:97.4 F (36.3 C), Max:98.4 F (36.9 C)   Recent Labs Lab 11/10/16 0205 11/10/16 0735 11/10/16 1555 11/10/16 2308 11/11/16 0409  WBC 18.0*  --   --   --  27.6*  CREATININE 4.18* 4.11* 3.97*  --  2.90*  LATICACIDVEN  --  2.3*  --  1.8  --     Estimated Creatinine Clearance: 39.2 mL/min (by C-G formula based on SCr of 2.9 mg/dL (H)).    Allergies  Allergen Reactions  . Penicillins Hives    Has patient had a PCN reaction causing immediate rash, facial/tongue/throat swelling, SOB or lightheadedness with hypotension: Yes Has patient had a PCN reaction causing severe rash involving mucus membranes or skin necrosis: No Has patient had a PCN reaction that required hospitalization unknown Has patient had a PCN reaction occurring within the last 10 years: No If all of the above answers are "NO", then may proceed with Cephalosporin use.   [facility]Q @ [facility] * [facility], (SCr 1.1 1/30, 2.9 2/1), but none since Vanc 2/4 >> Merrem 2/4 >>  Cultures 2/2 MRSA PCR >> neg  Thank you for allowing pharmacy to be a part of this patient's care.  Georgina PillionElizabeth Drayden Lukas, PharmD, BCPS Clinical Pharmacist Pager: [phone] Clinical phone for 11/11/2016 from 7a-3:30p: [phone] If after 3:30p,  please call main pharmacy at: [phone] 11/11/2016 10:20 AM

## 2016-11-12 ENCOUNTER — Inpatient Hospital Stay (HOSPITAL_COMMUNITY): Payer: Medicare Other

## 2016-11-12 DIAGNOSIS — R0600 Dyspnea, unspecified: Secondary | ICD-10-CM

## 2016-11-12 DIAGNOSIS — J111 Influenza due to unidentified influenza virus with other respiratory manifestations: Secondary | ICD-10-CM

## 2016-11-12 DIAGNOSIS — R6521 Severe sepsis with septic shock: Secondary | ICD-10-CM

## 2016-11-12 DIAGNOSIS — A419 Sepsis, unspecified organism: Secondary | ICD-10-CM

## 2016-11-12 LAB — BLOOD GAS, ARTERIAL
Acid-base deficit: 5.1 mmol/L — ABNORMAL HIGH (ref 0.0–2.0)
Acid-base deficit: 6.7 mmol/L — ABNORMAL HIGH (ref 0.0–2.0)
Bicarbonate: 21.6 mmol/L (ref 20.0–28.0)
Bicarbonate: 20.9 mmol/L (ref 20.0–28.0)
DELIVERY SYSTEMS: POSITIVE
DRAWN BY: 270221
Drawn by: 270221
Expiratory PAP: 10
FIO2: 40
FIO2: 60
Inspiratory PAP: 16
LHR: 20 {breaths}/min
O2 SAT: 95.7 %
O2 SAT: 97.8 %
PATIENT TEMPERATURE: 98
PATIENT TEMPERATURE: 98.4
PEEP/CPAP: 5 cmH2O
PH ART: 7.21 — AB (ref 7.350–7.450)
PH ART: 7.151 — AB (ref 7.350–7.450)
PO2 ART: 86.6 mmHg (ref 83.0–108.0)
RATE: 15 resp/min
VT: 550 mL
pCO2 arterial: 55.9 mmHg — ABNORMAL HIGH (ref 32.0–48.0)
pCO2 arterial: 62.3 mmHg — ABNORMAL HIGH (ref 32.0–48.0)
pO2, Arterial: 115 mmHg — ABNORMAL HIGH (ref 83.0–108.0)

## 2016-11-12 LAB — POCT I-STAT 3, ART BLOOD GAS (G3+)
ACID-BASE DEFICIT: 2 mmol/L (ref 0.0–2.0)
Acid-base deficit: 3 mmol/L — ABNORMAL HIGH (ref 0.0–2.0)
Acid-base deficit: 1 mmol/L (ref 0.0–2.0)
BICARBONATE: 24.2 mmol/L (ref 20.0–28.0)
BICARBONATE: 25.7 mmol/L (ref 20.0–28.0)
Bicarbonate: 26.1 mmol/L (ref 20.0–28.0)
O2 Saturation: 97 %
O2 Saturation: 97 %
O2 Saturation: 99 %
PCO2 ART: 69.6 mmHg — AB (ref 32.0–48.0)
PCO2 ART: 52.1 mmHg — AB (ref 32.0–48.0)
PH ART: 7.183 — AB (ref 7.350–7.450)
PH ART: 7.302 — AB (ref 7.350–7.450)
PO2 ART: 140 mmHg — AB (ref 83.0–108.0)
Patient temperature: 98.9
TCO2: 28 mmol/L (ref 0–100)
TCO2: 26 mmol/L (ref 0–100)
TCO2: 27 mmol/L (ref 0–100)
pCO2 arterial: 49.4 mmHg — ABNORMAL HIGH (ref 32.0–48.0)
pH, Arterial: 7.299 — ABNORMAL LOW (ref 7.350–7.450)
pO2, Arterial: 113 mmHg — ABNORMAL HIGH (ref 83.0–108.0)
pO2, Arterial: 99 mmHg (ref 83.0–108.0)

## 2016-11-12 LAB — RENAL FUNCTION PANEL
ALBUMIN: 2.8 g/dL — AB (ref 3.5–5.0)
Albumin: 2.3 g/dL — ABNORMAL LOW (ref 3.5–5.0)
Anion gap: 8 (ref 5–15)
Anion gap: 9 (ref 5–15)
BUN: 32 mg/dL — ABNORMAL HIGH (ref 6–20)
BUN: 31 mg/dL — AB (ref 6–20)
CHLORIDE: 102 mmol/L (ref 101–111)
CO2: 24 mmol/L (ref 22–32)
CO2: 25 mmol/L (ref 22–32)
CREATININE: 2.19 mg/dL — AB (ref 0.61–1.24)
Calcium: 7.8 mg/dL — ABNORMAL LOW (ref 8.9–10.3)
Calcium: 7.6 mg/dL — ABNORMAL LOW (ref 8.9–10.3)
Chloride: 103 mmol/L (ref 101–111)
Creatinine, Ser: 1.97 mg/dL — ABNORMAL HIGH (ref 0.61–1.24)
GFR calc Af Amer: 37 mL/min — ABNORMAL LOW (ref >60)
GFR calc Af Amer: 43 mL/min — ABNORMAL LOW (ref >60)
GFR calc non Af Amer: 32 mL/min — ABNORMAL LOW (ref >60)
GFR, EST NON AFRICAN AMERICAN: 37 mL/min — AB (ref >60)
GLUCOSE: 168 mg/dL — AB (ref 65–99)
Glucose, Bld: 182 mg/dL — ABNORMAL HIGH (ref 65–99)
PHOSPHORUS: 4.8 mg/dL — AB (ref 2.5–4.6)
POTASSIUM: 5.5 mmol/L — AB (ref 3.5–5.1)
POTASSIUM: 4.6 mmol/L (ref 3.5–5.1)
Phosphorus: 4.6 mg/dL (ref 2.5–4.6)
Sodium: 135 mmol/L (ref 135–145)
Sodium: 136 mmol/L (ref 135–145)

## 2016-11-12 LAB — GLUCOSE, CAPILLARY
GLUCOSE-CAPILLARY: 123 mg/dL — AB (ref 65–99)
GLUCOSE-CAPILLARY: 160 mg/dL — AB (ref 65–99)
Glucose-Capillary: 162 mg/dL — ABNORMAL HIGH (ref 65–99)
Glucose-Capillary: 122 mg/dL — ABNORMAL HIGH (ref 65–99)
Glucose-Capillary: 172 mg/dL — ABNORMAL HIGH (ref 65–99)
Glucose-Capillary: 203 mg/dL — ABNORMAL HIGH (ref 65–99)

## 2016-11-12 LAB — CBC
HCT: 24.7 % — ABNORMAL LOW (ref 39.0–52.0)
Hemoglobin: 8 g/dL — ABNORMAL LOW (ref 13.0–17.0)
MCH: 28.7 pg (ref 26.0–34.0)
MCHC: 32.4 g/dL (ref 30.0–36.0)
MCV: 88.5 fL (ref 78.0–100.0)
PLATELETS: 213 10*3/uL (ref 150–400)
RBC: 2.79 MIL/uL — AB (ref 4.22–5.81)
RDW: 15.5 % (ref 11.5–15.5)
WBC: 25.3 10*3/uL — AB (ref 4.0–10.5)

## 2016-11-12 LAB — ECHOCARDIOGRAM COMPLETE
Height: 68 in
Weight: 4730.19 oz

## 2016-11-12 LAB — POCT ACTIVATED CLOTTING TIME
ACTIVATED CLOTTING TIME: 197 s
Activated Clotting Time: 202 seconds
Activated Clotting Time: 202 seconds
Activated Clotting Time: 208 seconds
Activated Clotting Time: 208 seconds

## 2016-11-12 LAB — MAGNESIUM
MAGNESIUM: 2.6 mg/dL — AB (ref 1.7–2.4)
Magnesium: 2.6 mg/dL — ABNORMAL HIGH (ref 1.7–2.4)

## 2016-11-12 LAB — APTT: aPTT: >200 seconds (ref 24–36)

## 2016-11-12 LAB — PROCALCITONIN: PROCALCITONIN: 1.7 ng/mL

## 2016-11-12 LAB — PHOSPHORUS: PHOSPHORUS: 4.4 mg/dL (ref 2.5–4.6)

## 2016-11-12 LAB — INFLUENZA PANEL BY PCR (TYPE A & B)
INFLAPCR: POSITIVE — AB
Influenza B By PCR: NEGATIVE

## 2016-11-12 MED ORDER — OSELTAMIVIR PHOSPHATE 6 MG/ML PO SUSR
75.0000 mg | ORAL | Status: AC
  Administered 2016-11-12: 75 mg via ORAL
  Filled 2016-11-12: qty 12.5

## 2016-11-12 MED ORDER — ATROPINE SULFATE 1 MG/10ML IJ SOSY
PREFILLED_SYRINGE | INTRAMUSCULAR | Status: AC
  Filled 2016-11-12: qty 10

## 2016-11-12 MED ORDER — VECURONIUM BROMIDE 10 MG IV SOLR
INTRAVENOUS | Status: AC
  Administered 2016-11-12: 10 mg
  Filled 2016-11-12: qty 10

## 2016-11-12 MED ORDER — VITAL HIGH PROTEIN PO LIQD
1000.0000 mL | ORAL | Status: DC
  Administered 2016-11-12 (×2): 1000 mL
  Filled 2016-11-12: qty 1000

## 2016-11-12 MED ORDER — BISACODYL 10 MG RE SUPP
10.0000 mg | Freq: Once | RECTAL | Status: AC
  Administered 2016-11-12: 10 mg via RECTAL
  Filled 2016-11-12: qty 1

## 2016-11-12 MED ORDER — HYDROCORTISONE NA SUCCINATE PF 100 MG IJ SOLR
100.0000 mg | Freq: Two times a day (BID) | INTRAMUSCULAR | Status: DC
Start: 2016-11-12 — End: 2016-11-13
  Administered 2016-11-12 (×2): 100 mg via INTRAVENOUS
  Filled 2016-11-12 (×2): qty 2

## 2016-11-12 MED ORDER — PERFLUTREN LIPID MICROSPHERE
1.0000 mL | INTRAVENOUS | Status: AC | PRN
  Administered 2016-11-12: 2 mL via INTRAVENOUS
  Filled 2016-11-12: qty 10

## 2016-11-12 MED ORDER — SODIUM BICARBONATE 8.4 % IV SOLN
INTRAVENOUS | Status: DC
  Administered 2016-11-12 – 2016-11-14 (×4): via INTRAVENOUS
  Filled 2016-11-12 (×5): qty 150

## 2016-11-12 MED ORDER — STERILE WATER FOR INJECTION IJ SOLN
INTRAMUSCULAR | Status: AC
  Administered 2016-11-12: 10 mL
  Filled 2016-11-12: qty 10

## 2016-11-12 MED ORDER — PRISMASOL BGK 0/2.5 32-2.5 MEQ/L IV SOLN
INTRAVENOUS | Status: DC
  Administered 2016-11-12 – 2016-11-15 (×22): via INTRAVENOUS_CENTRAL
  Filled 2016-11-12 (×36): qty 5000

## 2016-11-12 MED ORDER — SODIUM CHLORIDE 0.9 % IV SOLN
0.0000 mg/h | INTRAVENOUS | Status: DC
  Administered 2016-11-12: 5 mg/h via INTRAVENOUS
  Administered 2016-11-12: 0.5 mg/h via INTRAVENOUS
  Administered 2016-11-13: 1 mg/h via INTRAVENOUS
  Filled 2016-11-12 (×4): qty 10

## 2016-11-12 MED ORDER — VECURONIUM BROMIDE 10 MG IV SOLR
10.0000 mg | INTRAVENOUS | Status: DC | PRN
  Administered 2016-11-12 – 2016-11-13 (×7): 10 mg via INTRAVENOUS
  Filled 2016-11-12 (×7): qty 10

## 2016-11-12 MED ORDER — OSELTAMIVIR PHOSPHATE 6 MG/ML PO SUSR
75.0000 mg | Freq: Two times a day (BID) | ORAL | Status: DC
  Filled 2016-11-12: qty 12.5

## 2016-11-12 MED ORDER — PRO-STAT SUGAR FREE PO LIQD
30.0000 mL | Freq: Two times a day (BID) | ORAL | Status: DC
  Administered 2016-11-12 (×2): 30 mL
  Filled 2016-11-12 (×4): qty 30

## 2016-11-12 MED ORDER — VITAL HIGH PROTEIN PO LIQD
1000.0000 mL | ORAL | Status: DC
  Administered 2016-11-12 – 2016-11-13 (×2): 1000 mL

## 2016-11-12 MED ORDER — OSELTAMIVIR PHOSPHATE 6 MG/ML PO SUSR
75.0000 mg | Freq: Two times a day (BID) | ORAL | Status: DC
  Administered 2016-11-12 – 2016-11-13 (×2): 75 mg via ORAL
  Filled 2016-11-12 (×3): qty 12.5

## 2016-11-12 NOTE — Progress Notes (Signed)
Initial Nutrition Assessment  DOCUMENTATION CODES:   Morbid obesity  INTERVENTION:    Vital High Protein at 70 ml/h (1680 ml per day)  Pro-stat 30 ml BID  Provides 1880 kcal, 177 gm protein, 1404 ml free water daily  NUTRITION DIAGNOSIS:   Inadequate oral intake related to inability to eat as evidenced by NPO status.  GOAL:   Provide needs based on ASPEN/SCCM guidelines  MONITOR:   Vent status, TF tolerance, Labs, I & O's  REASON FOR ASSESSMENT:   Consult Enteral/tube feeding initiation and management  ASSESSMENT:   56 y/o man with a PMH of COPD. [facility] with SOB thought r/t LLL PNA, treated with bipap.  Improved initially but developed worsening resp status as well as AKI and hyperkalemia and was tx to [facility] 2/2. Intubated on 2/3.  Discussed patient with RN today. Remains on CRRT. Received MD Consult for TF initiation and management. Currently receiving Vital High Protein via OGT at 40 ml/h (960 ml/day) with Prostat 30 ml BID per TF Protocol.   Patient is currently intubated on ventilator support MV: 14.5 L/min Temp (24hrs), Avg:98.9 F (37.2 C), Min:98.4 F (36.9 C), Max:99.6 F (37.6 C)  Labs reviewed potassium 5.5, magnesium 2.6, phosphorus 4.4 Medications reviewed. Nutrition-Focused physical exam completed. Findings are no fat depletion, no muscle depletion.   Diet Order:  Diet NPO time specified  Skin:  Reviewed, no issues  Last BM:  2/4  Height:   Ht Readings from Last 1 Encounters:  2017/09/29 5\' 8"  (1.727 m)    Weight:   Wt Readings from Last 1 Encounters:  11/12/16 295 lb 10.2 oz (134.1 kg)    Ideal Body Weight:  70 kg  BMI:  Body mass index is 44.95 kg/m.  Estimated Nutritional Needs:   Kcal:  [id number]  Protein:  175 gm  Fluid:  2.1 L  EDUCATION NEEDS:   No education needs identified at this time  Joaquin CourtsKimberly Chaye Misch, RD, LDN, CNSC Pager [phone] After Hours Pager [phone]

## 2016-11-12 NOTE — Procedures (Signed)
Central Venous Catheter Insertion Procedure Note Consuella LoseJohn R Kolker [id number] 05/03/1961  Procedure: Insertion of Central Venous Catheter Indications: Assessment of intravascular volume, Drug and/or fluid administration and Frequent blood sampling  Procedure Details Consent: Risks of procedure as well as the alternatives and risks of each were explained to the (patient/caregiver).  Consent for procedure obtained. Time Out: Verified patient identification, verified procedure, site/side was marked, verified correct patient position, special equipment/implants available, medications/allergies/relevent history reviewed, required imaging and test results available.  Performed  Maximum sterile technique was used including antiseptics, cap, gloves, gown, hand hygiene, mask and sheet. Skin prep: Chlorhexidine; local anesthetic administered A antimicrobial bonded/coated triple lumen catheter was placed in the left internal jugular vein using the Seldinger technique.  Evaluation Blood flow good Complications: No apparent complications Patient did tolerate procedure well. Chest X-ray ordered to verify placement.  CXR: pending.  Nelda BucksFEINSTEIN,DANIEL J. 11/12/2016, 3:19 PM  US  Mcarthur Rossettianiel J. Tyson AliasFeinstein, MD, FACP Pgr: [phone] [place] [facility]

## 2016-11-12 NOTE — Progress Notes (Signed)
Assessment/Plan: 1 AKI oliguric ATN, 2 COPD per CCM 3 VDRF  4 obesity 5 anemia stable 6 Poss pneumonia 7 Hyperkalemia  P CRRT, reduce K load  Subjective: Interval History:   Objective: Vital signs in last 24 hours: Temp:  [98.4 F (36.9 C)-99.6 F (37.6 C)] 98.4 F (36.9 C) (02/05 0741) Pulse Rate:  [70-110] 96 (02/05 0900) Resp:  [14-34] 30 (02/05 0900) BP: (103-120)/(48-54) 106/48 (02/05 0830) SpO2:  [86 %-100 %] 99 % (02/05 0900) Arterial Line BP: (91-149)/(41-60) 101/45 (02/05 0900) FiO2 (%):  [50 %-60 %] 50 % (02/05 0800) Weight:  [134.1 kg (295 lb 10.2 oz)] 134.1 kg (295 lb 10.2 oz) (02/05 0400) Weight change: -1.3 kg (-2 lb 13.9 oz)  Intake/Output from previous day: 02/04 0701 - 02/05 0700 In: 2556.8 [I.V.:1806.8; IV Piggyback:750] Out: 2352 [Urine:24] Intake/Output this shift: Total I/O In: 422.2 [I.V.:135.2; NG/GT:30; IV Piggyback:257] Out: 196 [Other:196]  General appearance: sedated Neck: no adenopathy, no carotid bruit, no JVD, supple, symmetrical, trachea midline, thyroid not enlarged, symmetric, no tenderness/mass/nodules and thick large neck GI: potuberant Extremitie1-2+edema 1-2+  Lab Results:  Recent Labs  11/11/16 0409 11/12/16 0423  WBC 27.6* 25.3*  HGB 9.1* 8.0*  HCT 28.1* 24.7*  PLT 242 213   BMET:  Recent Labs  11/11/16 1610 11/12/16 0423  NA 133* 135  K 5.1 5.5*  CL 98* 103  CO2 23 24  GLUCOSE 203* 168*  BUN 46* 32*  CREATININE 2.34* 2.19*  CALCIUM 7.4* 7.8*   No results for input(s): PTH in the last 72 hours. Iron Studies: No results for input(s): IRON, TIBC, TRANSFERRIN, FERRITIN in the last 72 hours. Studies/Results: Dg Chest Port 1 View  Result Date: 11/11/2016 CLINICAL DATA:  Dyspnea. EXAM: PORTABLE CHEST 1 VIEW COMPARISON:  Yesterday at 1030 hour, additional priors FINDINGS: Endotracheal tube 7 cm from the carina. Enteric tube in place, tip below the diaphragm. Tip of the right central line projects near the distal  brachiocephalic vein, unchanged. Volume loss in the right lung with right basilar opacity and probable effusion, unchanged from prior exam. Left lung base atelectasis is again seen. Unchanged heart size and mediastinal contours. Scattered calcified granulomas. IMPRESSION: 1. Support apparatus unchanged. 2. Unchanged right lung volume loss and right basilar opacity. While this may be due to atelectasis and pleural effusion, chest CT may be helpful to exclude central pulmonary mass. Electronically Signed   By: Rubye Oaks M.D.   On: 11/11/2016 05:26   Dg Chest Port 1 View  Result Date: 11/10/2016 CLINICAL DATA:  Central line placement. EXAM: PORTABLE CHEST 1 VIEW COMPARISON:  November 10, 2016 FINDINGS: An ET tube has been placed in the interval in good position. A new right central line terminates near the brachiocephalic confluence. No pneumothorax. There is a small right pleural effusion with underlying atelectasis. Mild atelectasis in the left base. No other interval changes. IMPRESSION: Support apparatus placement as above.  No pneumothorax. Small right pleural effusion with underlying atelectasis. Electronically Signed   By: Gerome Sam III M.D   On: 11/10/2016 11:18   Dg Abd Portable 1v  Result Date: 11/10/2016 CLINICAL DATA:  OG tube placement EXAM: PORTABLE ABDOMEN - 1 VIEW COMPARISON:  None. FINDINGS: Right pleural effusion with underlying atelectasis. An OG tube terminates in the right side of the abdomen, likely in the distal stomach or proximal duodenum. IMPRESSION: OG tube placement as above. I suspect the distal tip may be in the proximal duodenum. The patient may benefit from withdrawing several  cm. Electronically Signed   By: Dorise Bullion III M.D   On: 11/10/2016 11:19    Scheduled: . budesonide (PULMICORT) nebulizer solution  0.5 mg Nebulization BID  . chlorhexidine gluconate (MEDLINE KIT)  15 mL Mouth Rinse BID  . famotidine (PEPCID) IV  20 mg Intravenous Daily  . insulin  aspart  0-9 Units Subcutaneous Q4H  . ipratropium-albuterol  3 mL Nebulization Q6H  . mouth rinse  15 mL Mouth Rinse QID  . meropenem (MERREM) IV  1 g Intravenous Q12H  . methylPREDNISolone (SOLU-MEDROL) injection  40 mg Intravenous BID  . oseltamivir  30 mg Oral Q24H  . vancomycin  1,250 mg Intravenous Q24H     LOS: 3 days   Rosselyn Martha C 11/12/2016,9:37 AM

## 2016-11-12 NOTE — Progress Notes (Signed)
  Echocardiogram 2D Echocardiogram with definity has been performed.  Janalyn HarderWest, Loralei Radcliffe R 11/12/2016, 11:50 AM

## 2016-11-12 NOTE — Progress Notes (Signed)
eLink Physician-Brief Progress Note Patient Name: Henry LoseJohn R Watts DOB: 04/28/1961 MRN: [id number]   Date of Service  11/12/2016  HPI/Events of Note  PTT > 200 Pt needing high doses of heparin to keep CRRT filter from clotting off  eICU Interventions  Will check with nephrology if we can substitute heparin in CRRT D/C S.Q heparin     Intervention Category Intermediate Interventions: Other:  Thamas Appleyard 11/12/2016, 6:10 AM

## 2016-11-12 NOTE — Progress Notes (Signed)
PHARMACY NOTE:  ANTIMICROBIAL RENAL DOSAGE ADJUSTMENT  Current antimicrobial regimen includes a mismatch between antimicrobial dosage and estimated renal function.  As per policy approved by the Pharmacy & Therapeutics and Medical Executive Committees, the antimicrobial dosage will be adjusted accordingly.  Current antimicrobial dosage:  Tamiflu 30 mg q24h  Indication: Influenza positive  Renal Function:  Estimated Creatinine Clearance: 51.7 mL/min (by C-G formula based on SCr of 2.19 mg/dL (H)).  [x]      On CRRT    Antimicrobial dosage has been changed to:  Tamiflu 75 mg BID  Additional comments: Increased dose to Tamiflu 75 mg BID due to data via [employer] for recommendations in CVVHD patients.  Thank you for allowing pharmacy to be a part of this patient's care.  Casilda Carlsaylor Prentice Sackrider, PharmD, BCPS PGY-2 Infectious Diseases Pharmacy Resident Pager: [phone] 11/12/2016 4:04 PM

## 2016-11-12 NOTE — Progress Notes (Signed)
eLink Physician-Brief Progress Note Patient Name: Consuella LoseJohn R Isensee DOB: 09/08/1961 MRN: [id number]   Date of Service  11/12/2016  HPI/Events of Note  Dyssynchrony with vent Pt is on fentanyl gtt and frequent versed pushes. Unable to start propofol due to compatibility issues with other infusions  eICU Interventions  Versed drip     Intervention Category Intermediate Interventions: Other:  Amariya Liskey 11/12/2016, 2:52 AM

## 2016-11-12 NOTE — Progress Notes (Signed)
CRITICAL VALUE ALERT  Critical value received: ptt > 200  Date of notification:  11/12/16  Time of notification:  0549  Critical value read back:Yes.    Nurse who received alert:  Teodoro Spray Turner  MD notified (1st page):  Mannam  Time of first page:  [phone]  MD notified (2nd page):  Time of second page:  Responding MD:  Dr Isaiah SergeMannam  Time MD responded:  [phone]

## 2016-11-12 NOTE — Progress Notes (Addendum)
PULMONARY / CRITICAL CARE MEDICINE   Name: Henry Watts MRN: [id number] DOB: 1961-02-01    ADMISSION DATE:  11/17/2016 CONSULTATION DATE:  11/16/2016  REFERRING MD:  [facility]  CHIEF COMPLAINT:  Wheezing and shortness of breath  HISTORY OF PRESENT ILLNESS:   56 y/o man with a PMH of COPD. Initially admitted to [facility] 1/31 with SOB thought r/t LLL PNA, treated with bipap.  Improved initially but developed worsening resp status as well as AKI and hyperkalemia and was tx to [facility] 2/2.     SUBJECTIVE:  Shivering.  Remains on levophed, vasopressin - weaning slowly.  Remains on CVVHD    VITAL SIGNS: BP (!) 106/48 (BP Location: Left Arm)   Pulse 96   Temp 98.4 F (36.9 C) (Oral)   Resp (!) 30   Ht 5\' 8"  (1.727 m)   Wt 134.1 kg (295 lb 10.2 oz)   SpO2 99%   BMI 44.95 kg/m   HEMODYNAMICS:    VENTILATOR SETTINGS: Vent Mode: PRVC FiO2 (%):  [50 %-60 %] 50 % Set Rate:  [30 bmp] 30 bmp Vt Set:  [550 mL] 550 mL PEEP:  [5 cmH20] 5 cmH20 Plateau Pressure:  [21 cmH20-39 cmH20] 21 cmH20  INTAKE / OUTPUT: I/O last 3 completed shifts: In: 3714.3 [I.V.:2964.3; IV Piggyback:750] Out: 3476 [Urine:34; Other:3442]  PHYSICAL EXAMINATION: General:  Obese, chronically ill appearing male, NAD on vent  HEENT: MM pink/moist, ETT Neuro: sedated on vent, RASS -3 CV: s1s2 rrr, no m/r/g PULM: even/non-labored on vent, diminished throughout, few scattered wheeze GI: round, mildly distended, no BM, non-tender Extremities: warm/dry, scant BLE edema  Skin: no rashes or lesions   LABS:  BMET  Recent Labs Lab 11/11/16 0409 11/11/16 1610 11/12/16 0423  NA 135 133* 135  K 5.4* 5.1 5.5*  CL 102 98* 103  CO2 23 23 24   BUN 73* 46* 32*  CREATININE 2.90* 2.34* 2.19*  GLUCOSE 133* 203* 168*    Electrolytes  Recent Labs Lab 11/10/16 0205  11/11/16 0409 11/11/16 1610 11/12/16 0423  CALCIUM 8.4*  < > 7.6* 7.4* 7.8*  MG 2.5*  --  2.5*  --  2.6*  PHOS 8.8*  < > 5.9*  4.9* 4.8*  < > = values in this interval not displayed.  CBC  Recent Labs Lab 11/10/16 0205 11/11/16 0409 11/12/16 0423  WBC 18.0* 27.6* 25.3*  HGB 9.1* 9.1* 8.0*  HCT 28.5* 28.1* 24.7*  PLT 285 242 213    Coag's  Recent Labs Lab 11/10/16 0735 11/11/16 0409 11/12/16 0423  APTT 25 101* >200*  INR 1.12  --   --     Sepsis Markers  Recent Labs Lab 11/10/16 0735 11/10/16 2308 11/11/16 0409 11/12/16 0423  LATICACIDVEN 2.3* 1.8  --   --   PROCALCITON 1.32  --  2.25 1.70    ABG  Recent Labs Lab 11/10/16 1312 11/10/16 2322 11/11/16 0429  PHART 7.151* 7.160* 7.205*  PCO2ART 62.3* 68.8* 60.1*  PO2ART 115* 127.0* 123.0*    Liver Enzymes  Recent Labs Lab 11/10/16 0205  11/11/16 0409 11/11/16 1610 11/12/16 0423  AST 22  --   --   --   --   ALT 34  --   --   --   --   ALKPHOS 31*  --   --   --   --   BILITOT 0.4  --   --   --   --   ALBUMIN 2.9*  < >  2.7* 2.7* 2.8*  < > = values in this interval not displayed.  Cardiac Enzymes No results for input(s): TROPONINI, PROBNP in the last 168 hours.  Glucose  Recent Labs Lab 11/11/16 1140 11/11/16 1614 11/11/16 2024 11/12/16 0033 11/12/16 0402 11/12/16 0734  GLUCAP 137* 190* 164* 160* 162* 123*    Imaging No results found.   STUDIES:    CULTURES: MRSA 2/2 > (-) Blood culture and flu (-) at [place] RSV 2/4 >> POS Flu A  Blood 2/4 >> Trache 2/4 >>    ANTIBIOTICS: [facility] vanc 2/4 > Meropenem 2/4 > tamiflu 2/4>>>  SIGNIFICANT EVENTS: 1/31 admit at [facility] for SOB. Rx as PNA 2/2 [facility], resp fx  LINES/TUBES:   DISCUSSION: 56 y/o man with COPD exacerbation +/- pneumonia, developing renal and resp failure. [facility]. Intubated. On CVVH.    ASSESSMENT / PLAN:  PULMONARY A: Acute on Chronic Hypoxemic Hypercapneic resp failure - multifactorial r/t Flu, AECOPD, PNA +/- untreated OSA  Hx OSA - noncompliant  with CPAP  P:   Vent support - 8cc/kg  F/u CXR  F/u ABG Volume removal as able via CVVHD per renal  F/u CXR  Consider CT chest when more stable to further eval RLL infiltrate  IV steroids   F/u ABG    CARDIAC A:  Sinus tachycardia Volume overload Shock - presume septic  P : Volume off per cvvh Echo pending  Continue to wean pressors for MAP >65 Change to stress steroids -- on chronic pred for COPD   RENAL A:   AKI Hyperkalemia  P:   CVVHD per renal  F/u chem   INFECTIOUS A:   Concern for RLL PNA. Flu A POS Behaving like he is septic with hypotension, fevers, pressors. Not sure if its just  PNA.  P:   F/u cultures  Trend Pct  Continue vanc, meropenem  tamiflu   GI:  Constipation  P:  Start TF 2/5 pepcid  Dulcolax PR x1   HEME:  Leukocytosis  Anemia - mild  P:  SQ heparin  F/u CBC    ENDOCRINE A: DM P: SSI  Hold home metformin    NEUROLOGIC  A: Chronic Pain.  Anxiety Sedation needs on vent  P: RASS goal: -1 Continue fentanyl, versed gtt    FAMILY  - Updates: No family at bedside 2/5.  - Inter-disciplinary family meet or Palliative Care meeting due by:  11/16/16  CC time: 35 mins   Dirk DressKaty Whiteheart, NP 11/12/2016  9:43 AM Pager: [phone] or [phone]  STAFF NOTE: I, Rory Percyaniel Zeferino Mounts, MD FACP have personally reviewed patient's available data, including medical history, events of note, physical examination and test results as part of my evaluation. I have discussed with resident/NP and other care providers such as pharmacist, RN and RRT. In addition, I personally evaluated patient and elicited key findings of: rass -2, FC, improved rt base aeration, coarse BS, on cvvhd, no edema, abdo soft, pcxr show s improved rt base atx, ABg reviewed, now on rate 30, I am concerned about autpeep and copd, woul max rate 24 and have some permissive hypercapnia, repeat abg now, may need paralysis, maintain cvvhd, add vaso to levophed, map goal  65, ensure to stress roids, ph goal 7.20, pcxr in am, tamiflu dose adjust needed, maintain current abx regimen, pcn allergy noted, cvp needed, may need additional line, no family at bedside The patient is critically ill  with multiple organ systems failure and requires high complexity decision making for assessment and support, frequent evaluation and titration of therapies, application of advanced monitoring technologies and extensive interpretation of multiple databases.   Critical Care Time devoted to patient care services described in this note is 40 Minutes. This time reflects time of care of this signee: Rory Percy, MD FACP. This critical care time does not reflect procedure time, or teaching time or supervisory time of PA/NP/Med student/Med Resident etc but could involve care discussion time. Rest per NP/medical resident whose note is outlined above and that I agree with   Mcarthur Rossetti. Tyson Alias, MD, FACP Pgr: [phone] [facility] 11/12/2016 10:02 AM   Updated wife   I have had extensive discussions with family wife. We discussed patients current circumstances and organ failures. We also discussed patient's prior wishes under circumstances such as this. Family has decided to NOT perform resuscitation if arrest but to continue current medical support for now.  Mcarthur Rossetti. Tyson Alias, MD, FACP Pgr: [phone] [facility]

## 2016-11-13 ENCOUNTER — Inpatient Hospital Stay (HOSPITAL_COMMUNITY): Payer: Medicare Other

## 2016-11-13 DIAGNOSIS — R410 Disorientation, unspecified: Secondary | ICD-10-CM

## 2016-11-13 DIAGNOSIS — A419 Sepsis, unspecified organism: Principal | ICD-10-CM

## 2016-11-13 DIAGNOSIS — R6521 Severe sepsis with septic shock: Secondary | ICD-10-CM

## 2016-11-13 LAB — POCT I-STAT 3, ART BLOOD GAS (G3+)
Acid-Base Excess: 1 mmol/L (ref 0.0–2.0)
BICARBONATE: 26.8 mmol/L (ref 20.0–28.0)
Bicarbonate: 26.7 mmol/L (ref 20.0–28.0)
O2 Saturation: 96 %
O2 Saturation: 87 %
PH ART: 7.282 — AB (ref 7.350–7.450)
Patient temperature: 99.8
TCO2: 29 mmol/L (ref 0–100)
TCO2: 28 mmol/L (ref 0–100)
pCO2 arterial: 55.9 mmHg — ABNORMAL HIGH (ref 32.0–48.0)
pCO2 arterial: 49.1 mmHg — ABNORMAL HIGH (ref 32.0–48.0)
pH, Arterial: 7.348 — ABNORMAL LOW (ref 7.350–7.450)
pO2, Arterial: 90 mmHg (ref 83.0–108.0)
pO2, Arterial: 59 mmHg — ABNORMAL LOW (ref 83.0–108.0)

## 2016-11-13 LAB — APTT
APTT: 26 s (ref 24–36)
aPTT: >200 seconds (ref 24–36)
aPTT: >200 seconds (ref 24–36)

## 2016-11-13 LAB — CBC
HCT: 14.8 % — ABNORMAL LOW (ref 39.0–52.0)
HCT: 23.5 % — ABNORMAL LOW (ref 39.0–52.0)
HCT: 21.5 % — ABNORMAL LOW (ref 39.0–52.0)
HEMATOCRIT: 21.6 % — AB (ref 39.0–52.0)
HEMATOCRIT: 21.9 % — AB (ref 39.0–52.0)
HEMATOCRIT: 21.1 % — AB (ref 39.0–52.0)
HEMOGLOBIN: 7.4 g/dL — AB (ref 13.0–17.0)
HEMOGLOBIN: 7.5 g/dL — AB (ref 13.0–17.0)
Hemoglobin: 4.8 g/dL — CL (ref 13.0–17.0)
Hemoglobin: 7.8 g/dL — ABNORMAL LOW (ref 13.0–17.0)
Hemoglobin: 7.2 g/dL — ABNORMAL LOW (ref 13.0–17.0)
Hemoglobin: 7.5 g/dL — ABNORMAL LOW (ref 13.0–17.0)
MCH: 28.6 pg (ref 26.0–34.0)
MCH: 28.8 pg (ref 26.0–34.0)
MCH: 28.8 pg (ref 26.0–34.0)
MCH: 29.4 pg (ref 26.0–34.0)
MCH: 28.9 pg (ref 26.0–34.0)
MCH: 29.2 pg (ref 26.0–34.0)
MCHC: 32.4 g/dL (ref 30.0–36.0)
MCHC: 33.2 g/dL (ref 30.0–36.0)
MCHC: 33.8 g/dL (ref 30.0–36.0)
MCHC: 34.7 g/dL (ref 30.0–36.0)
MCHC: 34.1 g/dL (ref 30.0–36.0)
MCHC: 34.9 g/dL (ref 30.0–36.0)
MCV: 88.1 fL (ref 78.0–100.0)
MCV: 86.7 fL (ref 78.0–100.0)
MCV: 84.7 fL (ref 78.0–100.0)
MCV: 85.2 fL (ref 78.0–100.0)
MCV: 84.7 fL (ref 78.0–100.0)
MCV: 83.7 fL (ref 78.0–100.0)
PLATELETS: 189 10*3/uL (ref 150–400)
PLATELETS: 165 10*3/uL (ref 150–400)
PLATELETS: 141 10*3/uL — AB (ref 150–400)
PLATELETS: 86 10*3/uL — AB (ref 150–400)
Platelets: 140 10*3/uL — ABNORMAL LOW (ref 150–400)
Platelets: 141 10*3/uL — ABNORMAL LOW (ref 150–400)
RBC: 1.68 MIL/uL — ABNORMAL LOW (ref 4.22–5.81)
RBC: 2.71 MIL/uL — ABNORMAL LOW (ref 4.22–5.81)
RBC: 2.55 MIL/uL — ABNORMAL LOW (ref 4.22–5.81)
RBC: 2.57 MIL/uL — AB (ref 4.22–5.81)
RBC: 2.49 MIL/uL — ABNORMAL LOW (ref 4.22–5.81)
RBC: 2.57 MIL/uL — AB (ref 4.22–5.81)
RDW: 15.5 % (ref 11.5–15.5)
RDW: 13.9 % (ref 11.5–15.5)
RDW: 14.1 % (ref 11.5–15.5)
RDW: 14.3 % (ref 11.5–15.5)
RDW: 14.1 % (ref 11.5–15.5)
RDW: 14.5 % (ref 11.5–15.5)
WBC: 31.2 10*3/uL — ABNORMAL HIGH (ref 4.0–10.5)
WBC: 30.8 10*3/uL — AB (ref 4.0–10.5)
WBC: 27.5 10*3/uL — AB (ref 4.0–10.5)
WBC: 29.1 10*3/uL — ABNORMAL HIGH (ref 4.0–10.5)
WBC: 25.6 10*3/uL — AB (ref 4.0–10.5)
WBC: 17.9 10*3/uL — ABNORMAL HIGH (ref 4.0–10.5)

## 2016-11-13 LAB — GLUCOSE, CAPILLARY
GLUCOSE-CAPILLARY: 206 mg/dL — AB (ref 65–99)
GLUCOSE-CAPILLARY: 207 mg/dL — AB (ref 65–99)
Glucose-Capillary: 210 mg/dL — ABNORMAL HIGH (ref 65–99)
Glucose-Capillary: 217 mg/dL — ABNORMAL HIGH (ref 65–99)
Glucose-Capillary: 162 mg/dL — ABNORMAL HIGH (ref 65–99)
Glucose-Capillary: 193 mg/dL — ABNORMAL HIGH (ref 65–99)

## 2016-11-13 LAB — PREPARE RBC (CROSSMATCH)

## 2016-11-13 LAB — POCT ACTIVATED CLOTTING TIME
Activated Clotting Time: 131 seconds
Activated Clotting Time: 202 seconds
Activated Clotting Time: 202 seconds
Activated Clotting Time: 213 seconds

## 2016-11-13 LAB — PROTIME-INR
INR: 1.59
INR: 1.31
INR: 1.34
PROTHROMBIN TIME: 19.1 s — AB (ref 11.4–15.2)
PROTHROMBIN TIME: 16.6 s — AB (ref 11.4–15.2)
Prothrombin Time: 16.4 seconds — ABNORMAL HIGH (ref 11.4–15.2)

## 2016-11-13 LAB — RENAL FUNCTION PANEL
ALBUMIN: 1.8 g/dL — AB (ref 3.5–5.0)
ANION GAP: 10 (ref 5–15)
Albumin: 1.9 g/dL — ABNORMAL LOW (ref 3.5–5.0)
Anion gap: 7 (ref 5–15)
BUN: 31 mg/dL — ABNORMAL HIGH (ref 6–20)
BUN: 34 mg/dL — ABNORMAL HIGH (ref 6–20)
CHLORIDE: 103 mmol/L (ref 101–111)
CO2: 25 mmol/L (ref 22–32)
CO2: 27 mmol/L (ref 22–32)
Calcium: 7.2 mg/dL — ABNORMAL LOW (ref 8.9–10.3)
Calcium: 7.1 mg/dL — ABNORMAL LOW (ref 8.9–10.3)
Chloride: 100 mmol/L — ABNORMAL LOW (ref 101–111)
Creatinine, Ser: 1.6 mg/dL — ABNORMAL HIGH (ref 0.61–1.24)
Creatinine, Ser: 1.59 mg/dL — ABNORMAL HIGH (ref 0.61–1.24)
GFR calc non Af Amer: 47 mL/min — ABNORMAL LOW (ref >60)
GFR, EST AFRICAN AMERICAN: 55 mL/min — AB (ref >60)
GFR, EST AFRICAN AMERICAN: 55 mL/min — AB (ref >60)
GFR, EST NON AFRICAN AMERICAN: 48 mL/min — AB (ref >60)
GLUCOSE: 238 mg/dL — AB (ref 65–99)
Glucose, Bld: 243 mg/dL — ABNORMAL HIGH (ref 65–99)
PHOSPHORUS: 3.8 mg/dL (ref 2.5–4.6)
POTASSIUM: 4 mmol/L (ref 3.5–5.1)
POTASSIUM: 3.6 mmol/L (ref 3.5–5.1)
Phosphorus: 4.8 mg/dL — ABNORMAL HIGH (ref 2.5–4.6)
Sodium: 135 mmol/L (ref 135–145)
Sodium: 137 mmol/L (ref 135–145)

## 2016-11-13 LAB — CARBOXYHEMOGLOBIN - COOX: Carboxyhemoglobin: 1.5 % (ref 0.5–1.5)

## 2016-11-13 LAB — LACTATE DEHYDROGENASE: LDH: 230 U/L — AB (ref 98–192)

## 2016-11-13 LAB — HEPATIC FUNCTION PANEL
ALBUMIN: 1.7 g/dL — AB (ref 3.5–5.0)
ALT: 46 U/L (ref 17–63)
AST: 40 U/L (ref 15–41)
Alkaline Phosphatase: 26 U/L — ABNORMAL LOW (ref 38–126)
BILIRUBIN TOTAL: 0.5 mg/dL (ref 0.3–1.2)
Bilirubin, Direct: <0.1 mg/dL — ABNORMAL LOW (ref 0.1–0.5)
TOTAL PROTEIN: 3.5 g/dL — AB (ref 6.5–8.1)

## 2016-11-13 LAB — HEMOGLOBIN AND HEMATOCRIT, BLOOD
HCT: 23.3 % — ABNORMAL LOW (ref 39.0–52.0)
Hemoglobin: 7.8 g/dL — ABNORMAL LOW (ref 13.0–17.0)

## 2016-11-13 LAB — FIBRINOGEN: FIBRINOGEN: 260 mg/dL (ref 210–475)

## 2016-11-13 LAB — ABO/RH: ABO/RH(D): B POS

## 2016-11-13 LAB — LACTIC ACID, PLASMA: Lactic Acid, Venous: 4.1 mmol/L (ref 0.5–1.9)

## 2016-11-13 LAB — MAGNESIUM: Magnesium: 2.5 mg/dL — ABNORMAL HIGH (ref 1.7–2.4)

## 2016-11-13 LAB — PROCALCITONIN: PROCALCITONIN: 0.89 ng/mL

## 2016-11-13 MED ORDER — DIGOXIN 0.25 MG/ML IJ SOLN
0.5000 mg | Freq: Once | INTRAMUSCULAR | Status: AC
  Administered 2016-11-13: 0.5 mg via INTRAVENOUS
  Filled 2016-11-13: qty 2

## 2016-11-13 MED ORDER — AMIODARONE HCL IN DEXTROSE 360-4.14 MG/200ML-% IV SOLN
60.0000 mg/h | INTRAVENOUS | Status: DC
  Administered 2016-11-13 (×2): 60 mg/h via INTRAVENOUS
  Filled 2016-11-13 (×3): qty 200

## 2016-11-13 MED ORDER — AMIODARONE IV BOLUS ONLY 150 MG/100ML
150.0000 mg | Freq: Once | INTRAVENOUS | Status: AC
  Administered 2016-11-13: 150 mg via INTRAVENOUS

## 2016-11-13 MED ORDER — SODIUM CHLORIDE 0.9 % IV SOLN: Freq: Once | INTRAVENOUS | Status: AC

## 2016-11-13 MED ORDER — SODIUM CHLORIDE 0.9 % IV SOLN
1.0000 g | Freq: Two times a day (BID) | INTRAVENOUS | Status: DC
  Administered 2016-11-14 (×2): 1 g via INTRAVENOUS
  Filled 2016-11-13 (×3): qty 1

## 2016-11-13 MED ORDER — SODIUM CHLORIDE 0.9 % IV SOLN
Freq: Once | INTRAVENOUS | Status: AC
  Administered 2016-11-13: 10 mL/h via INTRAVENOUS

## 2016-11-13 MED ORDER — MIDAZOLAM HCL 2 MG/2ML IJ SOLN: 2.0000 mg | Freq: Once | INTRAMUSCULAR | Status: AC

## 2016-11-13 MED ORDER — FENTANYL CITRATE (PF) 100 MCG/2ML IJ SOLN
100.0000 ug | Freq: Once | INTRAMUSCULAR | Status: DC | PRN
Start: 2016-11-13 — End: 2016-11-22

## 2016-11-13 MED ORDER — SODIUM CHLORIDE 0.9 % IV BOLUS (SEPSIS)
1000.0000 mL | Freq: Once | INTRAVENOUS | Status: AC
  Administered 2016-11-13: 1000 mL via INTRAVENOUS

## 2016-11-13 MED ORDER — MIDAZOLAM HCL 5 MG/ML IJ SOLN
2.0000 mg/h | INTRAMUSCULAR | Status: DC
  Administered 2016-11-13 – 2016-11-14 (×2): 2 mg/h via INTRAVENOUS
  Administered 2016-11-14 – 2016-11-15 (×2): 6 mg/h via INTRAVENOUS
  Filled 2016-11-13 (×5): qty 10

## 2016-11-13 MED ORDER — FENTANYL CITRATE (PF) 100 MCG/2ML IJ SOLN
100.0000 ug | Freq: Once | INTRAMUSCULAR | Status: AC
Start: 2016-11-13 — End: 2016-11-13

## 2016-11-13 MED ORDER — SODIUM CHLORIDE 0.9 % IV BOLUS (SEPSIS)
500.0000 mL | Freq: Once | INTRAVENOUS | Status: AC
  Administered 2016-11-13: 500 mL via INTRAVENOUS

## 2016-11-13 MED ORDER — SODIUM CHLORIDE 0.9 % IV SOLN
100.0000 ug/h | INTRAVENOUS | Status: DC
  Administered 2016-11-13 – 2016-11-14 (×4): 300 ug/h via INTRAVENOUS
  Administered 2016-11-15: 400 ug/h via INTRAVENOUS
  Administered 2016-11-15: 200 ug/h via INTRAVENOUS
  Administered 2016-11-16 – 2016-11-17 (×5): 400 ug/h via INTRAVENOUS
  Filled 2016-11-13 (×11): qty 50

## 2016-11-13 MED ORDER — SODIUM CHLORIDE 0.9 % IV SOLN
0.0000 ug/min | INTRAVENOUS | Status: DC
  Administered 2016-11-13: 80 ug/min via INTRAVENOUS
  Filled 2016-11-13: qty 1

## 2016-11-13 MED ORDER — DILTIAZEM HCL 100 MG IV SOLR
5.0000 mg/h | INTRAVENOUS | Status: DC
  Administered 2016-11-13: 5 mg/h via INTRAVENOUS
  Administered 2016-11-13: 10 mg/h via INTRAVENOUS
  Filled 2016-11-13 (×3): qty 100

## 2016-11-13 MED ORDER — ALTEPLASE 2 MG IJ SOLR
2.0000 mg | Freq: Once | INTRAMUSCULAR | Status: AC
  Administered 2016-11-13: 1.2 mg
  Filled 2016-11-13: qty 2

## 2016-11-13 MED ORDER — DILTIAZEM LOAD VIA INFUSION
10.0000 mg | Freq: Once | INTRAVENOUS | Status: AC
  Administered 2016-11-13: 10 mg via INTRAVENOUS
  Filled 2016-11-13: qty 10

## 2016-11-13 MED ORDER — AMIODARONE LOAD VIA INFUSION
150.0000 mg | Freq: Once | INTRAVENOUS | Status: AC
  Administered 2016-11-13: 150 mg via INTRAVENOUS
  Filled 2016-11-13: qty 83.34

## 2016-11-13 MED ORDER — FENTANYL BOLUS VIA INFUSION
50.0000 ug | INTRAVENOUS | Status: DC | PRN
  Filled 2016-11-13: qty 50

## 2016-11-13 MED ORDER — MIDAZOLAM HCL 2 MG/2ML IJ SOLN
2.0000 mg | Freq: Once | INTRAMUSCULAR | Status: DC | PRN
Start: 2016-11-13 — End: 2016-11-15

## 2016-11-13 MED ORDER — MIDAZOLAM BOLUS VIA INFUSION
2.0000 mg | INTRAVENOUS | Status: DC | PRN
  Filled 2016-11-13: qty 2

## 2016-11-13 MED ORDER — AMIODARONE LOAD VIA INFUSION
150.0000 mg | Freq: Once | INTRAVENOUS | Status: AC
  Administered 2016-11-13: 150 mg via INTRAVENOUS

## 2016-11-13 MED ORDER — PHENYLEPHRINE HCL 10 MG/ML IJ SOLN
0.0000 ug/min | INTRAMUSCULAR | Status: DC
  Administered 2016-11-13: 400 ug/min via INTRAVENOUS
  Administered 2016-11-13: 100 ug/min via INTRAVENOUS
  Filled 2016-11-13 (×6): qty 4

## 2016-11-13 MED ORDER — STUDY - LJPC-501
1.2500 ng/kg/min | Status: AC
  Administered 2016-11-13: 5 ng/kg/min via INTRAVENOUS
  Administered 2016-11-13: 2.5 ng/kg/min via INTRAVENOUS
  Administered 2016-11-13: 5 ng/kg/min via INTRAVENOUS
  Administered 2016-11-13: 2.5 ng/kg/min via INTRAVENOUS
  Administered 2016-11-14: 40 ng/kg/min via INTRAVENOUS
  Administered 2016-11-14: 5 ng/kg/min via INTRAVENOUS
  Filled 2016-11-13 (×24): qty 1

## 2016-11-13 MED ORDER — SODIUM CHLORIDE 0.9 % IV SOLN
Freq: Once | INTRAVENOUS | Status: AC
  Administered 2016-11-13: 16:00:00 via INTRAVENOUS

## 2016-11-13 MED ORDER — HYDROCORTISONE NA SUCCINATE PF 100 MG IJ SOLR
100.0000 mg | Freq: Three times a day (TID) | INTRAMUSCULAR | Status: DC
  Administered 2016-11-13 – 2016-11-21 (×27): 100 mg via INTRAVENOUS
  Filled 2016-11-13 (×28): qty 2

## 2016-11-13 MED ORDER — AMIODARONE HCL IN DEXTROSE 360-4.14 MG/200ML-% IV SOLN
30.0000 mg/h | INTRAVENOUS | Status: DC
  Administered 2016-11-13 – 2016-11-15 (×4): 30 mg/h via INTRAVENOUS
  Filled 2016-11-13 (×10): qty 200

## 2016-11-13 MED ORDER — ARTIFICIAL TEARS OP OINT
1.0000 "application " | TOPICAL_OINTMENT | Freq: Three times a day (TID) | OPHTHALMIC | Status: DC
  Administered 2016-11-13 – 2016-11-15 (×6): 1 via OPHTHALMIC
  Filled 2016-11-13 (×2): qty 3.5

## 2016-11-13 MED ORDER — OSELTAMIVIR PHOSPHATE 6 MG/ML PO SUSR
75.0000 mg | Freq: Two times a day (BID) | ORAL | Status: AC
  Administered 2016-11-14 – 2016-11-17 (×7): 75 mg via ORAL
  Filled 2016-11-13 (×7): qty 12.5

## 2016-11-13 MED ORDER — SODIUM CHLORIDE 0.9 % IV SOLN
Freq: Once | INTRAVENOUS | Status: AC
  Administered 2016-11-13: 09:00:00 via INTRAVENOUS

## 2016-11-13 MED ORDER — CISATRACURIUM BESYLATE (PF) 200 MG/20ML IV SOLN
3.0000 ug/kg/min | INTRAVENOUS | Status: DC
  Administered 2016-11-13: 3 ug/kg/min via INTRAVENOUS
  Administered 2016-11-13 – 2016-11-14 (×3): 5 ug/kg/min via INTRAVENOUS
  Filled 2016-11-13 (×5): qty 20

## 2016-11-13 NOTE — Progress Notes (Signed)
eLink Physician-Brief Progress Note Patient Name: Henry LoseJohn R Swoveland DOB: 12/24/1960 MRN: [id number]   Date of Service  11/13/2016  HPI/Events of Note  Worsening shock. On neo and max levo. Suspect worsening sepsis  eICU Interventions  Check CVP Bolus 500cc NS Increase stress dose steroids hydrocortisone from 100 bid to q8 Start neo       Intervention Category Major Interventions: Hypotension - evaluation and management  Albertine Lafoy 11/13/2016, 12:41 AM

## 2016-11-13 NOTE — Progress Notes (Signed)
PULMONARY / CRITICAL CARE MEDICINE   Name: Henry Watts MRN: [id number] DOB: Jan 09, 1961    LOS: 5 days  HISTORY OF PRESENT ILLNESS:   56 y/o man with a PMH of COPD. Initially admitted to [facility] 1/31 with SOB thought r/t LLL PNA, treated with bipap.  Improved initially but developed worsening resp status as well as AKI and hyperkalemia and was tx to [facility] 2/2.     SUBJECTIVE:  Hgb decreased to 4.8 overnight with bleeding from CRRT noted. Heparin stopped and patient given 2U PRBC. Also with worsening hypotension overnight - levophed drip at max dose and neo started.  Hypothermic to 96.39F overnight. Temperature improved with Bair Hugger.  Remains on ventilator and CRRT.    VITAL SIGNS: BP (!) 93/45   Pulse (!) 102   Temp 97.7 F (36.5 C) (Axillary)   Resp (!) 26   Ht 5\' 8"  (1.727 m)   Wt (!) 304 lb 7.3 oz (138.1 kg)   SpO2 95%   BMI 46.29 kg/m   HEMODYNAMICS: CVP:  [7 mmHg] 7 mmHg  VENTILATOR SETTINGS: Vent Mode: PRVC FiO2 (%):  [50 %] 50 % Set Rate:  [24 bmp-30 bmp] 26 bmp Vt Set:  [550 mL] 550 mL PEEP:  [5 cmH20] 5 cmH20 Plateau Pressure:  [18 cmH20-28 cmH20] 22 cmH20  INTAKE / OUTPUT: I/O last 3 completed shifts: In: 6293.2 [I.V.:4666.2; Blood:30; NG/GT:1240; IV Piggyback:357] Out: 4782 [Urine:145; Other:4637]  PHYSICAL EXAMINATION: General:  Obese, chronically ill appearing male, NAD, sedated on vent  HEENT: MM pink/moist, ETT Neuro: sedated on vent, RASS -3 CV: s1s2 rrr, no m/r/g PULM: even/non-labored on vent, faint coarse breath sounds GI: rotund, mildly distended, no BM, non-tender Extremities: warm/dry, scant BLE edema  Skin: no rashes or lesions   LABS:  BMET  Recent Labs Lab 11/12/16 0423 11/12/16 1525 11/13/16 0336  NA 135 136 135  K 5.5* 4.6 4.0  CL 103 102 100*  CO2 24 25 25   BUN 32* 31* 31*  CREATININE 2.19* 1.97* 1.60*  GLUCOSE 168* 182* 238*    Electrolytes  Recent Labs Lab 11/12/16 0423 11/12/16 1122 11/12/16 1525  11/13/16 0336  CALCIUM 7.8*  --  7.6* 7.2*  MG 2.6* 2.6*  --  2.5*  PHOS 4.8* 4.4 4.6 4.8*    CBC  Recent Labs Lab 11/11/16 0409 11/12/16 0423 11/13/16 0336  WBC 27.6* 25.3* 31.2*  HGB 9.1* 8.0* 4.8*  HCT 28.1* 24.7* 14.8*  PLT 242 213 189    Coag's  Recent Labs Lab 11/10/16 0735  11/12/16 0423 11/13/16 0336 11/13/16 0447  APTT 25  < > >200* >200* >200*  INR 1.12  --   --   --  1.59  < > = values in this interval not displayed.  Sepsis Markers  Recent Labs Lab 11/10/16 0735 11/10/16 2308 11/11/16 0409 11/12/16 0423  LATICACIDVEN 2.3* 1.8  --   --   PROCALCITON 1.32  --  2.25 1.70    ABG  Recent Labs Lab 11/12/16 1134 11/12/16 1231 11/12/16 1538  PHART 7.183* 7.299* 7.302*  PCO2ART 69.6* 49.4* 52.1*  PO2ART 113.0* 99.0 140.0*    Liver Enzymes  Recent Labs Lab 11/10/16 0205  11/12/16 0423 11/12/16 1525 11/13/16 0336  AST 22  --   --   --   --   ALT 34  --   --   --   --   ALKPHOS 31*  --   --   --   --  BILITOT 0.4  --   --   --   --   ALBUMIN 2.9*  < > 2.8* 2.3* 1.9*  < > = values in this interval not displayed.  Cardiac Enzymes No results for input(s): TROPONINI, PROBNP in the last 168 hours.  Glucose  Recent Labs Lab 11/12/16 0734 11/12/16 1122 11/12/16 1534 11/12/16 1947 11/13/16 0005 11/13/16 0340  GLUCAP 123* 122* 172* 203* 207* 210*    Imaging Dg Chest Port 1 View  Result Date: 11/12/2016 CLINICAL DATA:  New left IJ central venous line placement EXAM: PORTABLE CHEST 1 VIEW COMPARISON:  1023 hours on the same day FINDINGS: New left IJ central venous line catheter is seen projecting over the superior mediastinum, likely in distal left brachiocephalic vein possibly at the SVC juncture the heart and mediastinal contours are stable. Endotracheal tube tip is 6 cm above the carina. Gastric tube extends below the left hemidiaphragm in the expected location the stomach. Right IJ catheter is unchanged with distal tip in proximal  SVC. Slight decrease in atelectasis at the right lung base. Small layering effusion is not entirely excluded. IMPRESSION: New left IJ central venous line catheter seen likely distal left brachiocephalic vein possibly at the SVC juncture. No pneumothorax. Slight decrease in right basilar atelectasis. Small layering right pleural effusion not excluded. Electronically Signed   By: Tollie Eth M.D.   On: 11/12/2016 15:50   Dg Chest Port 1 View  Result Date: 11/12/2016 CLINICAL DATA:  Endotracheal tube position. EXAM: PORTABLE CHEST 1 VIEW COMPARISON:  Radiograph of November 11, 2016. FINDINGS: Stable cardiomediastinal silhouette. Endotracheal and nasogastric tubes are unchanged in position. Right internal jugular catheter is also unchanged with distal tip in expected position of SVC. No pneumothorax is noted. Left lung is clear. Stable right basilar opacity is noted concerning for atelectasis or infiltrate with possible associated pleural effusion. Bony thorax is unremarkable. IMPRESSION: Stable support apparatus. Stable right basilar opacity as described above. Electronically Signed   By: Lupita Raider, M.D.   On: 11/12/2016 10:33   STUDIES:  Echo 2/5 - EF 55-60%, G1DD  CULTURES: MRSA 2/2 > (-) Blood culture and flu (-) at [place] RSV 2/4 >> POS Flu A  Blood 2/4 >> NGx1d Trach 2/4 >> pending   ANTIBIOTICS: Vanc and Levaquin at [facility] 2/4 > Meropenem 2/4 > Tamiflu 2/4>>>  SIGNIFICANT EVENTS: 1/31 admit at  for SOB. Rx as PNA 2/2 transferred to [facility] for AKI, resp fx  LINES/TUBES: RIJ Temp HD Cath 2/4> LIJ CVC 2/5> R femoral art line 2/4> ETT 2/4>  DISCUSSION: 56 y/o man with COPD exacerbation +/- pneumonia, developing renal and resp failure. Transferred to [facility] for further management. Intubated. On CVVH.    ASSESSMENT / PLAN:  PULMONARY A: Acute on Chronic Hypoxemic Hypercapneic resp failure - multifactorial r/t Flu, AECOPD, PNA +/- untreated OSA  Hx OSA -  noncompliant with CPAP  P:   Vent support   IV steroids to stress dosing in shock, no wheezing ABG now, assess MV needs, acidosis NO SBT  CARDIAC A:  Sinus tachycardia Volume overload Shock - presume septic  Echo 2/6 - EF 65-70%, G1DD - intravascularly  down P : Volume off per CRRT not indicated, no volume removal Continue levophed, phenylephrine, vasopressin drip.  Continue to wean pressors for MAP >65 Continue stress-dose steroids -- on chronic pred for COPD  cvp ? Accuracy Bolus  Stat transfusion  RENAL A:   AKI Hyperkalemia - resolved.   P:  CVVHD per renal  AM BMP  INFECTIOUS A:   Concern for RLL PNA. Flu A POS P:   Trach culture pending Trend Pct with declines Continue vanc, meropenem  Continue Tamiflu   GI:  Constipation, r/o retro bleed P:  Hold TF Pepcid   HEME:  Leukocytosis  Anemia - severe. S/p 2U PRBC 2/6. Heparin d/ced.   P:  F/u post-transfusion H&H Transfusion threshold <7 AM CBC  ENDOCRINE A: DM P: SSI  Hold home metformin    NEUROLOGIC  A: Chronic Pain.  Anxiety Sedation needs on vent  P: RASS goal: -1 Continue fentanyl, versed gtt - to off Get head CT also vec prn  Tarri AbernethyAbigail J Lancaster, MD, MPH PGY-2 [facility] Pager [phone]  STAFF NOTE: I, Rory Percyaniel Ashlin Kreps, MD FACP have personally reviewed patient's available data, including medical history, events of note, physical examination and test results as part of my evaluation. I have discussed with resident/NP and other care providers such as pharmacist, RN and RRT. In addition, I personally evaluated patient and elicited key findings of:  Major clinical changes overnight, anemia, ronchi rt base, not following commands, abdo distended, low BS, concern is retroper bleed with sudden drop hgb and now worsening shock, off heparin , may need protamine, get STAT ct abdo/pelvis, assess chest and head also, neo weak no role, increase levophed, continued vasopressin,  cvp 13 but ECHo shows mid cavity gradient, bolus aggressive follow trend,. Tx 1 unit further, ffp x 1, stat abg noted, increase rate 35, assess lactic, ldh, npo status, wife notified by RN of changes , she wil come in , I will update her then, maintain current ABX regimen The patient is critically ill with multiple organ systems failure and requires high complexity decision making for assessment and support, frequent evaluation and titration of therapies, application of advanced monitoring technologies and extensive interpretation of multiple databases.   Critical Care Time devoted to patient care services described in this note is 35 Minutes. This time reflects time of care of this signee: Rory Percyaniel Mylene Bow, MD FACP. This critical care time does not reflect procedure time, or teaching time or supervisory time of PA/NP/Med student/Med Resident etc but could involve care discussion time. Rest per NP/medical resident whose note is outlined above and that I agree with   Mcarthur Rossettianiel J. Tyson AliasFeinstein, MD, FACP Pgr: [phone] [facility] 11/13/2016 8:36 AM

## 2016-11-13 NOTE — Progress Notes (Signed)
Assessment/Plan: 1 AKI oliguric ATN, to keep even and volume expand with blood products 2 COPD per CCM 3 VDRF  4 obesity 5 anemia stable 6 Poss pneumonia 7 Hyperkalemia 8 ABLA, Will stop heparin with CRRT    Subjective: Interval History: Hbg down to 4.8 of uncertain cause  Volume to be given  Objective: Vital signs in last 24 hours: Temp:  [96.2 F (35.7 C)-98.9 F (37.2 C)] 98 F (36.7 C) (02/06 0945) Pulse Rate:  [37-120] 120 (02/06 0842) Resp:  [10-35] 35 (02/06 0945) BP: (93-107)/(45-48) 93/45 (02/05 2348) SpO2:  [82 %-100 %] 97 % (02/06 0842) Arterial Line BP: (75-252)/(37-92) 111/50 (02/06 0945) FiO2 (%):  [50 %] 50 % (02/06 0842) Weight:  [138.1 kg (304 lb 7.3 oz)] 138.1 kg (304 lb 7.3 oz) (02/06 0339) Weight change: 4 kg (8 lb 13.1 oz)  Intake/Output from previous day: 02/05 0701 - 02/06 0700 In: 5297.1 [I.V.:3770.1; Blood:30; NG/GT:1240; IV Piggyback:257] Out: 8127 [Urine:135] Intake/Output this shift: Total I/O In: 30 [Blood:30] Out: 96 [Other:96]  General appearance: no obvious source of bleeding  Exam otherwise unchanged  Lab Results:  Recent Labs  11/12/16 0423 11/13/16 0336 11/13/16 0746  WBC 25.3* 31.2*  --   HGB 8.0* 4.8* 7.8*  HCT 24.7* 14.8* 23.3*  PLT 213 189  --    BMET:  Recent Labs  11/12/16 1525 11/13/16 0336  NA 136 135  K 4.6 4.0  CL 102 100*  CO2 25 25  GLUCOSE 182* 238*  BUN 31* 31*  CREATININE 1.97* 1.60*  CALCIUM 7.6* 7.2*   No results for input(s): PTH in the last 72 hours. Iron Studies: No results for input(s): IRON, TIBC, TRANSFERRIN, FERRITIN in the last 72 hours. Studies/Results: Dg Chest Port 1 View  Result Date: 11/12/2016 CLINICAL DATA:  New left IJ central venous line placement EXAM: PORTABLE CHEST 1 VIEW COMPARISON:  1023 hours on the same day FINDINGS: New left IJ central venous line catheter is seen projecting over the superior mediastinum, likely in distal left brachiocephalic vein possibly at the SVC  juncture the heart and mediastinal contours are stable. Endotracheal tube tip is 6 cm above the carina. Gastric tube extends below the left hemidiaphragm in the expected location the stomach. Right IJ catheter is unchanged with distal tip in proximal SVC. Slight decrease in atelectasis at the right lung base. Small layering effusion is not entirely excluded. IMPRESSION: New left IJ central venous line catheter seen likely distal left brachiocephalic vein possibly at the SVC juncture. No pneumothorax. Slight decrease in right basilar atelectasis. Small layering right pleural effusion not excluded. Electronically Signed   By: Ashley Royalty M.D.   On: 11/12/2016 15:50   Dg Chest Port 1 View  Result Date: 11/12/2016 CLINICAL DATA:  Endotracheal tube position. EXAM: PORTABLE CHEST 1 VIEW COMPARISON:  Radiograph of November 11, 2016. FINDINGS: Stable cardiomediastinal silhouette. Endotracheal and nasogastric tubes are unchanged in position. Right internal jugular catheter is also unchanged with distal tip in expected position of SVC. No pneumothorax is noted. Left lung is clear. Stable right basilar opacity is noted concerning for atelectasis or infiltrate with possible associated pleural effusion. Bony thorax is unremarkable. IMPRESSION: Stable support apparatus. Stable right basilar opacity as described above. Electronically Signed   By: Marijo Conception, M.D.   On: 11/12/2016 10:33    Scheduled: . sodium chloride   Intravenous Once  . budesonide (PULMICORT) nebulizer solution  0.5 mg Nebulization BID  . chlorhexidine gluconate (MEDLINE KIT)  15  mL Mouth Rinse BID  . famotidine (PEPCID) IV  20 mg Intravenous Daily  . feeding supplement (PRO-STAT SUGAR FREE 64)  30 mL Per Tube BID  . feeding supplement (VITAL HIGH PROTEIN)  1,000 mL Per Tube Q24H  . hydrocortisone sod succinate (SOLU-CORTEF) inj  100 mg Intravenous Q8H  . insulin aspart  0-9 Units Subcutaneous Q4H  . ipratropium-albuterol  3 mL Nebulization  Q6H  . mouth rinse  15 mL Mouth Rinse QID  . meropenem (MERREM) IV  1 g Intravenous Q12H  . oseltamivir  75 mg Oral BID  . sodium chloride  1,000 mL Intravenous Once  . vancomycin  1,250 mg Intravenous Q24H     LOS: 4 days   Leondra Cullin C 11/13/2016,10:22 AM

## 2016-11-13 NOTE — Research (Signed)
Title: Expanded Access for [employer].gov Identifier: [id number], Protocol No: [id number])  RESEARCH SUBJECT. LJPC-501 (angiotensin II) is synthetic human angiotensin II with isoleucine at the 5-position (Ile5-angiotensin II). Angiotensin II is a naturally produced peptide hormone that regulates blood pressure by vasoconstriction and sodium reabsorption.  This drug has already been approved by the FDA for patients with distributive shock who remain hypotensive despite receiving fluid and vasopressor therapy. The primary objective of the study is to provide access to [place] prior to drug being commercially available.  This [employer] (EAP) is Sponsored by [employer], [place], [place].   Key Benefit: The EAP protocol follows completion of Phase-3 ATHOS-3 trial that  in patients with distributive shock showed significantly higher percentage of patients achieved target blood pressure (70%) v placebo (23%). In addition, there was statistically significant benefit with study drug at 48h with SOFA scores and there was a  non-statistically significant trend towards 28 day mortality benefit.  Key Inclusion Criteria: Adult patients, received/receiving a total sum norepinephrine (NE) equivalent unit dose of > 0.2 g/kg/min  (e.g. > 14 mcg/min in a 70 kg adult) for at least 2 hours prior to the LJPC-501 infusion, adequately volume resuscitated, central venous access and an arterial line present, estimated/measured cardiac index (CI) > 2.3 L/min/m2 OR a concurrent CVP > 8 mmHg AND ScvO2 > 70%.  Key Exclusion Criteria: MAP > 80 mmHg; acute occlusive coronary syndrome requiring intervention; liver failure with a MELD score = 30; acute mesenteric ischemia or history of mesenteric ischemia, Raynaud's phenomenon, systemic sclerosis, or vasospastic disease; expected lifespan of < 24 hours; active bleeding AND an anticipated need for transfusion of > 4 units PRBCs within 48 hours  of the initiation of LJPC-501; active bleeding AND hemoglobin < 7 g/dL; ANC < 500 cells/mm^3; known allergy to mannitol; not mechanically ventilated with a history of asthma or currently experiencing bronchospasm requiring the use of inhaled bronchodilators; known to be pregnant.  Summary of Dosing and Administration of LJPC-501: Administer only via central line starting at 5ng/kg/min for clinical target of MAP. Titrate in 10ng/kg/min increments when below clinical target to max dose of 40ng/kg/min . Max duration is 7 days or at [facility] / MD clinical discretion. When target MAP reached - wean off epinephrine first, then vasopressin and then other catecholamines. Weaning off study drug is gradual over 15 minutes and per protocol.  Summary of Known Risks: Results from a Phase 3 study of LJPC-501 (ATHOS-3) in adults suggest that in critically ill patient population, administration of LJPC-501 was well tolerated. Fewer LJPC-501-treated patients discontinued treatment due to an AE than placebo-treated patients: 14% versus 22%, respectively. There were fewer LJPC-501-treated patients with at least one reported Adverse Events (AE) than placebo-treated patients: 87% versus 92%, respectively.  There were fewer Serious Adverse Events (SAE; multi-organ failure, cardiac arrest, and respiratory failure) than placebo treated patient; 60.7% versus 67.1%   Phase 3 LJPC-501 study (ATHOS-3): key side effects include cardiac disorders (cardiac arrests, atrial fibrillation, ventricular tachycardia, cardiorespiratory arrest, cardiogenic shock, acute myocardial infarction, tachycardia, ventricular fibrillation, supraventricular tachycardia, bradycardia), GI disorders (intestinal ischemia), respiratory, thoracic, and mediastinal disorders (respiratory failure, acute respiratory failure, pneumothorax, ARDS, respiratory distress)  Key adverse events from Phase 3 LJPC-501 study (ATHOS-3):   LJPC-501 Placebo  Multi organ Failure 15.3%  14.6%  Hypotension 3.1% 1.9%  Peripheral Ischemia 4.3% 2.5%  GI Disorders 23.3% 20.3%  Intestinal ischemia 0.6% 1.9%  Atrial fibrillation 13.5% 13.3%  Bradycardia 4.3% 7.0%  Cardiac Arrest 4.3%  5.7%  Ventricular Tachycardia 3.1% 1.9%  Respiratory, thoracic, and mediastinal disorders 23.9% 25.9%  Respiratory Failure 5.5% 7.6%  ARDS 0% 1.3%  Nervous System disorders 8.3% 12.0%  Acute Kidney Injury 4.9% 6.3%   NOTE: The most common adverse reactions reported in greater than 10% LJPC-501 treated patients were thromboembolic events. Because there is a potential for venous and arterial thrombotic and thromboembolic events in patients who receive study drug, use concurrent venous thromboembolism (VTE) prophylaxis. ..................................................................................................Marland Kitchen.  [id number] Informed Consent   Subject Name: Henry Watts  This patient, Henry Watts, has been consented to the above clinical trial according to FDA regulations, GCP guidelines and [facility]'s SOPs. Because the mental status and emotional capacity of this patient was not adequate to give an informed consent, the informed consent form and study design have been explained to this patient's legally authorized representative, Henry Watts (spouse), by this Study Coordinator at 12:40 pm on 11/13/2016. The surrogate demonstrated comprehension of the trial and study requirements/expectations. No study procedures have been initiated before consenting of this patient/surrogate. This surrogate was given sufficient time for reading the consent form. All risks, benefits and options have been thoroughly discussed and all questions were answered per the surrogate's satisfaction. This patient/surrogate was not coerced in any way to participate in this clinical trial. The surrogate has voluntarily signed consent version 3.0, local version 2.0 at 1:04 pm on 11/13/2016. A copy of the signed consent  form was given to the surrogate and a copy was placed in the subject's medical record. This surrogate was thanked for this patient's participation in research and contribution to science.  Toula MoosFrances J Kallie Depolo, RN Clinical Research Nurse  [facility] Office: [phone]

## 2016-11-13 NOTE — Procedures (Signed)
Arterial Catheter Insertion Procedure Note Henry LoseJohn R Watts [id number] 09/30/1961  Procedure: Insertion of Arterial Catheter  Indications: Blood pressure monitoring and Frequent blood sampling  Procedure Details Consent: Risks of procedure as well as the alternatives and risks of each were explained to the (patient/caregiver).  Consent for procedure obtained. Time Out: Verified patient identification, verified procedure, site/side was marked, verified correct patient position, special equipment/implants available, medications/allergies/relevent history reviewed, required imaging and test results available.  Performed  Maximum sterile technique was used including antiseptics, cap, gloves, gown, hand hygiene, mask and sheet. Skin prep: Chlorhexidine; local anesthetic administered 20 gauge catheter was inserted into left femoral artery using the Seldinger technique.  Evaluation Blood flow good; BP tracing good. Complications: No apparent complications.   Nelda BucksFEINSTEIN,Henry Kidd J. 11/13/2016  US Failed radials maxed pressors  Mcarthur Rossettianiel J. Tyson AliasFeinstein, MD, FACP Pgr: [phone] [facility]

## 2016-11-13 NOTE — Progress Notes (Addendum)
eLink Physician-Brief Progress Note Patient Name: Henry R JeConsuella Watts DOB: 08/04/1961 MRN: [id number]   Date of Service  11/13/2016  HPI/Events of Note  Hb 4.8 PTT high, getting heparin via CRRT Oozing from rt forearm. No other sign of bleed  eICU Interventions  Stop heparin via CRRT 2 units PRBC transfusion. Consider CT abd when more stable     Intervention Category Major Interventions: Other:  [name] 11/13/2016, 4:24 AM

## 2016-11-13 NOTE — Progress Notes (Signed)
PULMONARY / CRITICAL CARE MEDICINE   Name: Henry Watts MRN: [id number] DOB: Mar 02, 1961    LOS: 6 days  HISTORY OF PRESENT ILLNESS:   56 y/o man with a PMH of COPD. Initially admitted to [facility] 1/31 with SOB thought r/t LLL PNA, treated with bipap.  Improved initially but developed worsening resp status as well as AKI and hyperkalemia and was tx to [facility] 2/2.     SUBJECTIVE:  Amazing response to Angiotenin 2 infusion Still ventilated and requiring pressors for support. Received additional 1U PRBC overnight for Hgb 6.8.    VITAL SIGNS: BP (!) 121/57   Pulse 62   Temp 98.4 F (36.9 C) (Oral)   Resp (!) 26   Ht 5' 8"  (1.727 m)   Wt (!) 326 lb 11.6 oz (148.2 kg)   SpO2 99%   BMI 49.68 kg/m   HEMODYNAMICS: CVP:  [7 mmHg-9 mmHg] 8 mmHg  VENTILATOR SETTINGS: Vent Mode: PRVC FiO2 (%):  [50 %-60 %] 50 % Set Rate:  [26 bmp-35 bmp] 26 bmp Vt Set:  [550 mL] 550 mL PEEP:  [5 cmH20] 5 cmH20 Plateau Pressure:  [17 cmH20-30 cmH20] 24 cmH20  INTAKE / OUTPUT: I/O last 3 completed shifts: In: 10377.5 [I.V.:7331.5; Blood:1519; NG/GT:1020; IV DTOIZTIWP:809] Out: 2644 [Urine:450; Other:2194]  PHYSICAL EXAMINATION: General:  Obese, chronically ill appearing male, NAD, sedated on vent  HEENT: MM pink/moist, ETT Neuro: sedated on vent, RASS -3, does not open eyes or respond to commands CV: RRR, no murmurs appreciated PULM: even/non-labored on vent, coarse breath sounds GI: rotund, mildly distended, no BM, non-tender Extremities: warm/dry, scant BLE edema  Skin: no rashes or lesions   LABS:  BMET  Recent Labs Lab 11/13/16 0336 11/13/16 1522 11/14/16 0345  NA 135 137 139  K 4.0 3.6 3.7  CL 100* 103 102  CO2 25 27 29   BUN 31* 34* 33*  CREATININE 1.60* 1.59* 1.69*  GLUCOSE 238* 243* 192*    Electrolytes  Recent Labs Lab 11/12/16 1122  11/13/16 0336 11/13/16 1522 11/14/16 0345  CALCIUM  --   < > 7.2* 7.1* 7.0*  MG 2.6*  --  2.5*  --  2.2  PHOS 4.4  < > 4.8* 3.8  3.4  < > = values in this interval not displayed.  CBC  Recent Labs Lab 11/13/16 2000 11/13/16 2356 11/14/16 0205 11/14/16 0345  WBC 17.9* 15.7*  --  17.5*  HGB 7.5* 6.8* 7.2* 7.4*  HCT 21.5* 19.9* 21.7* 21.7*  PLT 86* 83*  --  87*    Coag's  Recent Labs Lab 11/13/16 0447 11/13/16 1415 11/13/16 1522 11/14/16 0345  APTT >200*  --  26 27  INR 1.59 1.31 1.34  --     Sepsis Markers  Recent Labs Lab 11/10/16 0735 11/10/16 2308  11/12/16 0423 11/13/16 0842 11/14/16 0345  LATICACIDVEN 2.3* 1.8  --   --  4.1*  --   PROCALCITON 1.32  --   < > 1.70 0.89 1.41  < > = values in this interval not displayed.  ABG  Recent Labs Lab 11/12/16 1538 11/13/16 0832 11/13/16 1622  PHART 7.302* 7.282* 7.348*  PCO2ART 52.1* 55.9* 49.1*  PO2ART 140.0* 90.0 59.0*    Liver Enzymes  Recent Labs Lab 11/10/16 0205  11/13/16 0842 11/13/16 1522 11/14/16 0345  AST 22  --  40  --  78*  ALT 34  --  46  --  98*  ALKPHOS 31*  --  26*  --  25*  BILITOT 0.4  --  0.5  --  0.4  ALBUMIN 2.9*  < > 1.7* 1.8* 1.6*  < > = values in this interval not displayed.  Cardiac Enzymes No results for input(s): TROPONINI, PROBNP in the last 168 hours.  Glucose  Recent Labs Lab 11/13/16 0732 11/13/16 1124 11/13/16 1519 11/13/16 2007 11/14/16 0009 11/14/16 0414  GLUCAP 217* 162* 206* 193* 204* 172*    Imaging Ct Abdomen Pelvis Wo Contrast  Result Date: 11/13/2016 CLINICAL DATA:  Dropping hemoglobin.  Assess for slight blood loss. EXAM: CT CHEST, ABDOMEN AND PELVIS WITHOUT CONTRAST TECHNIQUE: Multidetector CT imaging of the chest, abdomen and pelvis was performed following the standard protocol without IV contrast. COMPARISON:  03/14/2015 FINDINGS: CT CHEST FINDINGS Chest wall: No chest wall mass, hematoma, supraclavicular or axillary adenopathy. The thyroid gland is grossly normal. Cardiovascular: The heart is normal in size. No pericardial effusion. The aorta is normal in caliber.  Scattered atherosclerotic calcifications. Central lines are noted bilaterally. No complicating features. Mediastinum/Nodes: Calcified mediastinal and hilar lymph nodes. No mass, adenopathy or hematoma. The esophagus contains a NG tube. Head endotracheal tube is noted in the trachea. Lungs/Pleura: No pulmonary edema. Small bilateral pleural effusions and right lower lobe atelectasis or infiltrate. Numerous calcified granulomas are noted. No worrisome pulmonary lesions. Musculoskeletal: No significant bony findings. CT ABDOMEN PELVIS FINDINGS Hepatobiliary: No focal hepatic lesions or intrahepatic biliary dilatation. Pancreas: No mass, inflammation or ductal dilatation. Spleen: Scattered calcified granulomas are noted throughout the spleen. Adrenals/Urinary Tract: The adrenal glands are normal. Lower pole right renal calculus. No hydronephrosis or obstructing ureteral calculi. No renal or bladder mass lesions. The bladder contains a Foley catheter. Stomach/Bowel: The stomach, duodenum, small bowel and colon are grossly normal without oral contrast. No inflammatory changes, mass lesions or obstructive findings. The terminal ileum is normal. The appendix is normal. The tip of the NG tube is in the descending duodenum. Vascular/Lymphatic: Scattered atherosclerotic calcifications involving the aorta and iliac arteries. No abdominal/pelvic lymphadenopathy or mass. Reproductive: The prostate gland and seminal vesicles are unremarkable. Other: There appears to be an arterial line in the right femoral artery. There is marked diffuse subcutaneous and intra and inter muscular edema, fluid and hematoma involving the right upper thigh. This also involves the scrotum. I would be worried that the arterial line could be leaking. Musculoskeletal: No significant bony findings. IMPRESSION: 1. Large amount of fluid/edema/blood in the right upper thigh possibly from a leaking arterial line or possibly from a recent procedure. Recommend  clinical correlation. 2. No significant intraabdominal/intrapelvic findings. 3. Small bilateral pleural effusions and right lower lobe atelectasis or clearing infiltrate. These results will be called to the ordering clinician or representative by the Radiologist Assistant, and communication documented in the PACS or zVision Dashboard. Electronically Signed   By: Marijo Sanes M.D.   On: 11/13/2016 11:29   Ct Head Wo Contrast  Result Date: 11/13/2016 CLINICAL DATA:  Low hemoglobin, altered mental status EXAM: CT HEAD WITHOUT CONTRAST TECHNIQUE: Contiguous axial images were obtained from the base of the skull through the vertex without intravenous contrast. COMPARISON:  None. FINDINGS: Brain: The ventricles are normal in size and configuration and the septum is in a normal midline position. The fourth ventricle basilar cisterns are unremarkable. No hemorrhage, mass lesion, or acute infarction is seen. Vascular: On this unenhanced study, no vascular abnormality is noted. Skull: On bone window images, no calvarial abnormality is seen. Sinuses/Orbits: There are probable retention cyst in the floor of both  maxillary sinuses. Some mucosal thickening is present throughout ethmoid air cells. There be a small amount of fluid in the maxillary sinuses as well and maxillary sinusitis is a definite consideration. Endotracheal tube is noted. Other: None. IMPRESSION: 1. No acute intracranial abnormality. 2. Probable maxillary sinus disease. 3. Endotracheal tube present. Electronically Signed   By: Ivar Drape M.D.   On: 11/13/2016 11:09   Ct Chest Wo Contrast  Result Date: 11/13/2016 CLINICAL DATA:  Dropping hemoglobin.  Assess for slight blood loss. EXAM: CT CHEST, ABDOMEN AND PELVIS WITHOUT CONTRAST TECHNIQUE: Multidetector CT imaging of the chest, abdomen and pelvis was performed following the standard protocol without IV contrast. COMPARISON:  03/14/2015 FINDINGS: CT CHEST FINDINGS Chest wall: No chest wall mass,  hematoma, supraclavicular or axillary adenopathy. The thyroid gland is grossly normal. Cardiovascular: The heart is normal in size. No pericardial effusion. The aorta is normal in caliber. Scattered atherosclerotic calcifications. Central lines are noted bilaterally. No complicating features. Mediastinum/Nodes: Calcified mediastinal and hilar lymph nodes. No mass, adenopathy or hematoma. The esophagus contains a NG tube. Head endotracheal tube is noted in the trachea. Lungs/Pleura: No pulmonary edema. Small bilateral pleural effusions and right lower lobe atelectasis or infiltrate. Numerous calcified granulomas are noted. No worrisome pulmonary lesions. Musculoskeletal: No significant bony findings. CT ABDOMEN PELVIS FINDINGS Hepatobiliary: No focal hepatic lesions or intrahepatic biliary dilatation. Pancreas: No mass, inflammation or ductal dilatation. Spleen: Scattered calcified granulomas are noted throughout the spleen. Adrenals/Urinary Tract: The adrenal glands are normal. Lower pole right renal calculus. No hydronephrosis or obstructing ureteral calculi. No renal or bladder mass lesions. The bladder contains a Foley catheter. Stomach/Bowel: The stomach, duodenum, small bowel and colon are grossly normal without oral contrast. No inflammatory changes, mass lesions or obstructive findings. The terminal ileum is normal. The appendix is normal. The tip of the NG tube is in the descending duodenum. Vascular/Lymphatic: Scattered atherosclerotic calcifications involving the aorta and iliac arteries. No abdominal/pelvic lymphadenopathy or mass. Reproductive: The prostate gland and seminal vesicles are unremarkable. Other: There appears to be an arterial line in the right femoral artery. There is marked diffuse subcutaneous and intra and inter muscular edema, fluid and hematoma involving the right upper thigh. This also involves the scrotum. I would be worried that the arterial line could be leaking. Musculoskeletal:  No significant bony findings. IMPRESSION: 1. Large amount of fluid/edema/blood in the right upper thigh possibly from a leaking arterial line or possibly from a recent procedure. Recommend clinical correlation. 2. No significant intraabdominal/intrapelvic findings. 3. Small bilateral pleural effusions and right lower lobe atelectasis or clearing infiltrate. These results will be called to the ordering clinician or representative by the Radiologist Assistant, and communication documented in the PACS or zVision Dashboard. Electronically Signed   By: Marijo Sanes M.D.   On: 11/13/2016 11:29   STUDIES:  Echo 2/5 - EF 55-60%, G1DD  CULTURES: MRSA 2/2 > (-) Blood culture and flu (-) at [place] RSV 2/4 >> POS Flu A  Blood 2/4 >> NGx1d Trach 2/4 >> pending   ANTIBIOTICS: Vanc and Levaquin at [employer] 2/4 >>>2/7 Meropenem 2/4 >>> Tamiflu 2/4>>>  SIGNIFICANT EVENTS: 1/31 admit at [place] for SOB. Rx as PNA 2/2 transferred to [facility] for AKI, resp fx 2/6 shock, some bleeding from rt fem a line (hematoma), 3 units prbc, ffp, refractory shock resolved by angiotensin infusion  LINES/TUBES: RIJ Temp HD Cath 2/4> LIJ CVC 2/5> R femoral art line 2/4>2/6 Left fem aline 2/6>>> ETT 2/4>  DISCUSSION: 55  y/o man with COPD exacerbation +/- pneumonia, developing renal and resp failure. Transferred to [facility] for further management. Intubated. On CVVH.    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   P:   CVVHD per renal  AM BMP  INFECTIOUS A:   Concern for RLL PNA Flu A POS Procalcitonin increased this AM P:   Trach culture pending Trend Pct further Continue meropenem Dc vanc  Continue Tamiflu, dc at day 5  GI:  Constipation CT abd 2/6 with no signs of retro bleed. P:  Restart TF Pepcid  LFt wnl  HEME:  Leukocytosis - worsening.  Anemia - severe. S/p 2U PRBC and 2U FFP 2/6. Additional 1U PRBC on 2/7. Hematoma from R femoral line possibly contributing.  Drop plat (conusmption, diltution) P:  Transfusion threshold <7 AM and PM CBC scd  ENDOCRINE A: DM AI P: SSI  Stress roids   NEUROLOGIC  A: Chronic Pain.  Anxiety Sedation needs on vent  P: RASS goal: -5 with paralysis--> change to -2, dc paralysis  Continue fentanyl gtt Nimbex- to dc  Adin Hector, MD, MPH PGY-2 [facility]  STAFF NOTE: I, Merrie Roof, MD FACP have personally reviewed patient's available data, including medical history, events of note, physical examination and test results as part of my evaluation. I have discussed with resident/NP and other care providers such as pharmacist, RN and RRT. In addition, I personally evaluated patient and elicited key findings of: paralyzed, sedated, no sig crackles on lungs, hgb more stable, abdo soft, rt fem ols side swollen, not warm, incredible/ unbelievable response to Angiotensin 2 infusion, went off pressors in 3.5 hours from 70 levo / vasop, now all off, take angio to 15 tyhen off over 1 -2 hours if MAP goals met, keep stress roids, dc paralysis, rass goal to -2, ABg now on higher MV, likely to dc bicarb drip likely to be able to reduce rate further, pcxr now and in am , cbc for afternoon, tamiflu to stop date, dc vanc, maintain meropenem, wil update wife The patient is critically ill with multiple organ systems failure and  requires high complexity decision making for assessment and support, frequent evaluation and titration of therapies, application of advanced monitoring technologies and extensive interpretation of multiple databases.   Critical Care Time devoted to patient care services described in this note is 35 Minutes. This time reflects time of care of this signee: Merrie Roof, MD FACP. This critical care time does not reflect procedure time, or teaching time or supervisory time of PA/NP/Med student/Med Resident etc but could involve care discussion time. Rest per NP/medical resident whose note is outlined above and that I agree with   Lavon Paganini. Titus Mould, MD, [place] Pgr: [facility] 11/14/2016 8:59 AM

## 2016-11-13 NOTE — Progress Notes (Signed)
Title: Expanded Access for [employer]LJPC-501 (ClinicalTrials.gov Identifier: [id number], Protocol No: LJ501-EAP01)  RESEARCH SUBJECT. LJPC-501 (angiotensin II) is synthetic human angiotensin II with isoleucine at the 5-position (Ile5-angiotensin II). Angiotensin II is a naturally produced peptide hormone that regulates blood pressure by vasoconstriction and sodium reabsorption.  This drug has already been approved by the FDA for patients with distributive shock who remain hypotensive despite receiving fluid and vasopressor therapy. The primary objective of the study is to provide access to LJPC-501 prior to drug being commercially available.  This Systems analystxpanded Access Program (EAP) is Sponsored by [employer], [place], [place].   ...................................................................................................  Clinical Research Coordinator / Research RN note : This visit for Subject Henry Watts with 10/03/1961 on 11/13/2016 for the above protocol is Visit/Encounter Day 1 and is for purpose of above protocol. The consent for this encounter is under Protocol Version 2.0 and is currently IRB approved. Per [place] call center,  [facility] and ScVO2 inclusion/exclusion criteria has been waived and patient has been approved to receive drug. All screening/baseline procedures have been completed per protocol. Subject's LAR/spouse, Daryel GeraldKatina Mcbrearty expressed continued interest and consent in continuing Mr. Marlyne BeardsJennings as a study subject.   Study drug initiated at 16:00 by bedside RN. Infusion initiated at 5 ng/kg/min. This RN and Kinnie FeilStacey Phelps, RN at bedside for first hour of infusion to monitor infusion start-up. At initiation of infusion patient was hyper responsive. Infusion paused for 2-3 minutes and resumed at 2.5 ng/kg/min. See MAR for titration records. Dr. Tyson AliasFeinstein updated on patient status.   Bedside RN to continue to monitor patient throughout infusion and titrate per protocol. Per Rory Percyaniel  Feinstein, MD, max dose of infusion to be no greater than 40 ng/kg/min. Please call research nurse or Rory Percyaniel Feinstein, MD if any questions.   Toula MoosFrances J Rollie Hynek, RN Clinical Research Nurse  [facility], [facility] Office: [phone]  4:25 PM 11/13/2016

## 2016-11-13 NOTE — Progress Notes (Signed)
Patient ID: Consuella LoseJohn R Seneca, male   DOB: 10/02/1961, 56 y.o.   MRN: [id number] Continued management throughout day  Refractory shock, large majority septic, contirbution bleeding now resolved from RT fem Aline Ct done, no retroper bleed, showed rt fem a line hematoma,  FFP, prbc, removed and clinically improved ABG noted rate initialy increased but now some autopeep Ph improved, re reduce rate  Daughter and wife updated multiple times  CVP 13, distributive shock mostly Bleeding controlled Aline replaced Is a good candidatre for Angiotensin 2 study  Has refractory shock, 71 levophed Have d/w wife   Now fib rvr, amio, dig x 1 cardizem His high levo need is driving this,  Will add angiotenin infusion in hopes to reduce other levo needs and hopefully reduce rvr  Mcarthur Rossettianiel J. Tyson AliasFeinstein, MD, FACP Pgr: [phone] [facility]  Ccm time now 90 min

## 2016-11-13 NOTE — Significant Event (Signed)
CRITICAL VALUE ALERT  Critical value received: Lactic Acid 4.1  Date of notification: 11/13/16  Time of notification:  0842  Critical value read back: yes  Nurse who received alert: Burnard BuntingKatrice Emonte Dieujuste, RN  MD notified:  Dr Tyson AliasFeinstein  Time: [phone]

## 2016-11-13 NOTE — Procedures (Signed)
Arterial Catheter Insertion Procedure Note Henry Watts [id number] 11/11/1960  Procedure: InsertiConsuella Watts of Arterial Catheter  Indications: Blood pressure monitoring and Frequent blood sampling  Procedure Details Consent: Risks of procedure as well as the alternatives and risks of each were explained to the (patient/caregiver).  Consent for procedure obtained. Time Out: Verified patient identification, verified procedure, site/side was marked, verified correct patient position, special equipment/implants available, medications/allergies/relevent history reviewed, required imaging and test results available.  Performed  Maximum sterile technique was used including antiseptics, cap, gloves, gown, hand hygiene, mask and sheet. Skin prep: Chlorhexidine; local anesthetic administered 20 gauge catheter was inserted into left radial artery using the Seldinger technique.  Evaluation Blood flow poor; BP tracing poor. Complications: No apparent complications.   Henry Watts,Henry Glaza J. 11/13/2016  polaced US guidance Then occluded  Removed  Henry Rossettianiel J. Henry AliasFeinstein, MD, FACP Pgr: [phone] [facility]

## 2016-11-14 ENCOUNTER — Encounter (HOSPITAL_COMMUNITY): Payer: Self-pay | Admitting: *Deleted

## 2016-11-14 ENCOUNTER — Inpatient Hospital Stay (HOSPITAL_COMMUNITY): Payer: Medicare Other

## 2016-11-14 DIAGNOSIS — Z452 Encounter for adjustment and management of vascular access device: Secondary | ICD-10-CM

## 2016-11-14 DIAGNOSIS — Z01818 Encounter for other preprocedural examination: Secondary | ICD-10-CM

## 2016-11-14 LAB — POCT I-STAT 3, ART BLOOD GAS (G3+)
ACID-BASE EXCESS: 3 mmol/L — AB (ref 0.0–2.0)
ACID-BASE EXCESS: 6 mmol/L — AB (ref 0.0–2.0)
BICARBONATE: 30.4 mmol/L — AB (ref 20.0–28.0)
Bicarbonate: 28.6 mmol/L — ABNORMAL HIGH (ref 20.0–28.0)
O2 SAT: 90 %
O2 SAT: 99 %
TCO2: 30 mmol/L (ref 0–100)
TCO2: 32 mmol/L (ref 0–100)
pCO2 arterial: 47.9 mmHg (ref 32.0–48.0)
pCO2 arterial: 41 mmHg (ref 32.0–48.0)
pH, Arterial: 7.384 (ref 7.350–7.450)
pH, Arterial: 7.477 — ABNORMAL HIGH (ref 7.350–7.450)
pO2, Arterial: 60 mmHg — ABNORMAL LOW (ref 83.0–108.0)
pO2, Arterial: 140 mmHg — ABNORMAL HIGH (ref 83.0–108.0)

## 2016-11-14 LAB — CBC
HCT: 21.5 % — ABNORMAL LOW (ref 39.0–52.0)
HEMATOCRIT: 20.9 % — AB (ref 39.0–52.0)
HEMATOCRIT: 19.9 % — AB (ref 39.0–52.0)
HEMATOCRIT: 21.7 % — AB (ref 39.0–52.0)
HEMOGLOBIN: 6.8 g/dL — AB (ref 13.0–17.0)
Hemoglobin: 7.4 g/dL — ABNORMAL LOW (ref 13.0–17.0)
Hemoglobin: 7.3 g/dL — ABNORMAL LOW (ref 13.0–17.0)
Hemoglobin: 7.1 g/dL — ABNORMAL LOW (ref 13.0–17.0)
MCH: 28.1 pg (ref 26.0–34.0)
MCH: 28.3 pg (ref 26.0–34.0)
MCH: 28.4 pg (ref 26.0–34.0)
MCH: 28.9 pg (ref 26.0–34.0)
MCHC: 34.1 g/dL (ref 30.0–36.0)
MCHC: 34 g/dL (ref 30.0–36.0)
MCHC: 34.2 g/dL (ref 30.0–36.0)
MCHC: 34 g/dL (ref 30.0–36.0)
MCV: 82.5 fL (ref 78.0–100.0)
MCV: 83.3 fL (ref 78.0–100.0)
MCV: 83.6 fL (ref 78.0–100.0)
MCV: 84.7 fL (ref 78.0–100.0)
PLATELETS: 69 10*3/uL — AB (ref 150–400)
Platelets: 80 10*3/uL — ABNORMAL LOW (ref 150–400)
Platelets: 83 10*3/uL — ABNORMAL LOW (ref 150–400)
Platelets: 87 10*3/uL — ABNORMAL LOW (ref 150–400)
RBC: 2.63 MIL/uL — ABNORMAL LOW (ref 4.22–5.81)
RBC: 2.58 MIL/uL — ABNORMAL LOW (ref 4.22–5.81)
RBC: 2.35 MIL/uL — AB (ref 4.22–5.81)
RBC: 2.5 MIL/uL — ABNORMAL LOW (ref 4.22–5.81)
RDW: 14.7 % (ref 11.5–15.5)
RDW: 15.5 % (ref 11.5–15.5)
RDW: 16 % — AB (ref 11.5–15.5)
RDW: 16.4 % — AB (ref 11.5–15.5)
WBC: 15.7 10*3/uL — ABNORMAL HIGH (ref 4.0–10.5)
WBC: 16.4 10*3/uL — ABNORMAL HIGH (ref 4.0–10.5)
WBC: 16.7 10*3/uL — AB (ref 4.0–10.5)
WBC: 17.5 10*3/uL — ABNORMAL HIGH (ref 4.0–10.5)

## 2016-11-14 LAB — GLUCOSE, CAPILLARY
GLUCOSE-CAPILLARY: 145 mg/dL — AB (ref 65–99)
GLUCOSE-CAPILLARY: 166 mg/dL — AB (ref 65–99)
GLUCOSE-CAPILLARY: 172 mg/dL — AB (ref 65–99)
GLUCOSE-CAPILLARY: 172 mg/dL — AB (ref 65–99)
Glucose-Capillary: 159 mg/dL — ABNORMAL HIGH (ref 65–99)
Glucose-Capillary: 165 mg/dL — ABNORMAL HIGH (ref 65–99)
Glucose-Capillary: 204 mg/dL — ABNORMAL HIGH (ref 65–99)

## 2016-11-14 LAB — COMPREHENSIVE METABOLIC PANEL
ALT: 98 U/L — AB (ref 17–63)
AST: 78 U/L — AB (ref 15–41)
Albumin: 1.6 g/dL — ABNORMAL LOW (ref 3.5–5.0)
Alkaline Phosphatase: 25 U/L — ABNORMAL LOW (ref 38–126)
Anion gap: 8 (ref 5–15)
BILIRUBIN TOTAL: 0.4 mg/dL (ref 0.3–1.2)
BUN: 33 mg/dL — AB (ref 6–20)
CO2: 29 mmol/L (ref 22–32)
CREATININE: 1.69 mg/dL — AB (ref 0.61–1.24)
Calcium: 7 mg/dL — ABNORMAL LOW (ref 8.9–10.3)
Chloride: 102 mmol/L (ref 101–111)
GFR, EST AFRICAN AMERICAN: 51 mL/min — AB (ref >60)
GFR, EST NON AFRICAN AMERICAN: 44 mL/min — AB (ref >60)
Glucose, Bld: 192 mg/dL — ABNORMAL HIGH (ref 65–99)
POTASSIUM: 3.7 mmol/L (ref 3.5–5.1)
Sodium: 139 mmol/L (ref 135–145)
TOTAL PROTEIN: 3.4 g/dL — AB (ref 6.5–8.1)

## 2016-11-14 LAB — RENAL FUNCTION PANEL
ALBUMIN: 1.7 g/dL — AB (ref 3.5–5.0)
Anion gap: 5 (ref 5–15)
BUN: 29 mg/dL — AB (ref 6–20)
CO2: 29 mmol/L (ref 22–32)
Calcium: 7.3 mg/dL — ABNORMAL LOW (ref 8.9–10.3)
Chloride: 105 mmol/L (ref 101–111)
Creatinine, Ser: 1.13 mg/dL (ref 0.61–1.24)
GFR calc Af Amer: >60 mL/min (ref >60)
GFR calc non Af Amer: >60 mL/min (ref >60)
GLUCOSE: 164 mg/dL — AB (ref 65–99)
PHOSPHORUS: 3.6 mg/dL (ref 2.5–4.6)
POTASSIUM: 3.6 mmol/L (ref 3.5–5.1)
Sodium: 139 mmol/L (ref 135–145)

## 2016-11-14 LAB — MAGNESIUM
MAGNESIUM: 2.2 mg/dL (ref 1.7–2.4)
Magnesium: 2.2 mg/dL (ref 1.7–2.4)
Magnesium: 2.2 mg/dL (ref 1.7–2.4)

## 2016-11-14 LAB — PREPARE FRESH FROZEN PLASMA
UNIT DIVISION: 0
Unit division: 0

## 2016-11-14 LAB — PREPARE RBC (CROSSMATCH)

## 2016-11-14 LAB — PHOSPHORUS
PHOSPHORUS: 3.4 mg/dL (ref 2.5–4.6)
PHOSPHORUS: 3.5 mg/dL (ref 2.5–4.6)
Phosphorus: 3 mg/dL (ref 2.5–4.6)

## 2016-11-14 LAB — PROCALCITONIN: Procalcitonin: 1.41 ng/mL

## 2016-11-14 LAB — HEMOGLOBIN AND HEMATOCRIT, BLOOD
HEMATOCRIT: 21.7 % — AB (ref 39.0–52.0)
HEMOGLOBIN: 7.2 g/dL — AB (ref 13.0–17.0)

## 2016-11-14 LAB — APTT: APTT: 27 s (ref 24–36)

## 2016-11-14 LAB — VANCOMYCIN, RANDOM: Vancomycin Rm: 11

## 2016-11-14 MED ORDER — DILTIAZEM HCL-DEXTROSE 100-5 MG/100ML-% IV SOLN (PREMIX)
5.0000 mg/h | INTRAVENOUS | Status: DC
  Administered 2016-11-14: 10 mg/h via INTRAVENOUS
  Filled 2016-11-14: qty 100

## 2016-11-14 MED ORDER — VECURONIUM BOLUS VIA INFUSION: 10.0000 mg | INTRAVENOUS | Status: DC

## 2016-11-14 MED ORDER — VITAL HIGH PROTEIN PO LIQD
1000.0000 mL | ORAL | Status: DC
  Administered 2016-11-14: 1000 mL

## 2016-11-14 MED ORDER — VECURONIUM BROMIDE 10 MG IV SOLR
INTRAVENOUS | Status: AC
  Administered 2016-11-14: 10 mg
  Filled 2016-11-14: qty 10

## 2016-11-14 MED ORDER — VECURONIUM BROMIDE 10 MG IV SOLR
10.0000 mg | INTRAVENOUS | Status: DC | PRN
  Administered 2016-11-14 – 2016-11-20 (×26): 10 mg via INTRAVENOUS
  Filled 2016-11-14 (×27): qty 10

## 2016-11-14 MED ORDER — SODIUM CHLORIDE 0.9 % IV SOLN
Freq: Once | INTRAVENOUS | Status: AC
  Administered 2016-11-14: 10 mL/h via INTRAVENOUS

## 2016-11-14 MED ORDER — VITAL HIGH PROTEIN PO LIQD
1000.0000 mL | ORAL | Status: AC
  Administered 2016-11-14: 1000 mL
  Administered 2016-11-15 (×2)
  Administered 2016-11-15 (×2): 1000 mL
  Administered 2016-11-16: 05:00:00
  Administered 2016-11-16: 1000 mL
  Administered 2016-11-16 (×2)
  Administered 2016-11-17 – 2016-11-19 (×4): 1000 mL

## 2016-11-14 MED ORDER — VANCOMYCIN HCL 10 G IV SOLR
1500.0000 mg | INTRAVENOUS | Status: DC
  Filled 2016-11-14: qty 1500

## 2016-11-14 MED ORDER — PRO-STAT SUGAR FREE PO LIQD
30.0000 mL | Freq: Two times a day (BID) | ORAL | Status: DC
  Administered 2016-11-14 – 2016-11-16 (×6): 30 mL
  Administered 2016-11-17: 10:00:00
  Administered 2016-11-17 – 2016-11-21 (×8): 30 mL
  Filled 2016-11-14 (×15): qty 30

## 2016-11-14 MED ORDER — VECURONIUM BROMIDE 10 MG IV SOLR
10.0000 mg | INTRAVENOUS | Status: DC
  Filled 2016-11-14 (×2): qty 10

## 2016-11-14 NOTE — Progress Notes (Addendum)
  Pharmacy Antibiotic Note  Henry Watts is a 56 y.o. male admitted on 12/03/2016 with sepsis, on CRRT.  Pharmacy has been consulted for vancomycin and meropenem dosing. Patient with right groin bleed yesterday and heparin was discontinued with CRRT. CRRT was off for ~10 hours yesterday due to complications and clotting of the machine. Last dose of vancomycin was 2/6 0815, random level this morning is 11. Meropenem and tamiflu were adjusted to give next dose this AM due to prolonged off time with CRRT.   Plan: - Vancomycin 1500 mg IV q24h - Continue meropenem 1g IV q12h - Monitor CRRT run time, C&S and length of therapy   11/14/2016 8:52 AM: D/c vancomycin per Dr. Tyson AliasFeinstein  Height: 5\' 8"  (172.7 cm) Weight: (!) 326 lb 11.6 oz (148.2 kg) IBW/kg (Calculated) : 68.4  Temp (24hrs), Avg:98.7 F (37.1 C), Min:98 F (36.7 C), Max:99.8 F (37.7 C)   Recent Labs Lab 11/10/16 0735  11/10/16 2308  11/12/16 0423 11/12/16 1525 11/13/16 0336  11/13/16 0842 11/13/16 1415 11/13/16 1522 11/13/16 2000 11/13/16 2356 11/14/16 0345  WBC  --   --   --   < > 25.3*  --  31.2*  < >  --  29.1*  27.5* 25.6* 17.9* 15.7* 17.5*  CREATININE 4.11*  < >  --   < > 2.19* 1.97* 1.60*  --   --   --  1.59*  --   --  1.69*  LATICACIDVEN 2.3*  --  1.8  --   --   --   --   --  4.1*  --   --   --   --   --   < > = values in this interval not displayed.  Estimated Creatinine Clearance: 70.9 mL/min (by C-G formula based on SCr of 1.69 mg/dL (H)).    Allergies  Allergen Reactions  . Penicillins Hives    Has patient had a PCN reaction causing immediate rash, facial/tongue/throat swelling, SOB or lightheadedness with hypotension: Yes Has patient had a PCN reaction causing severe rash involving mucus membranes or skin necrosis: No Has patient had a PCN reaction that required hospitalization unknown Has patient had a PCN reaction occurring within the last 10 years: No If all of the above answers are "NO", then may  proceed with Cephalosporin use.    Antimicrobials this admission: [facility]com[facility] and [facility] * [facility] (SCr 1.1 1/30, 2.9 2/1) Vanc 2/4 >> Merrem 2/4 >> Tamiflu 2/4>>  Dose adjustments this admission: 2/7 Vancomycin random: 11 (will increase vancomycin dose to 1500 mg IV q24h (~10mg /kg)  Microbiology results: 1/30 [facility] Blood Cx: ngtd  2/2 MRSA PCR: neg 2/4 Influenza: positive 2/4 BCx: ngtd 2/4 Trach asp: no org seen  Thank you for allowing pharmacy to be a part of this patient's care.  Casilda Carlsaylor Hanaa Payes, PharmD, BCPS PGY-2 Infectious Diseases Pharmacy Resident Pager: [phone] 11/14/2016 8:04 AM

## 2016-11-14 NOTE — Care Management Note (Signed)
Case Management Note  Patient Details  Name: Consuella LoseJohn R Golob MRN: [id number] Date of Birth: 04/25/1961  Subjective/Objective:         Pt admitted with SOB - history of COPD           Action/Plan:   PTA independent from home with wife.  Pt is on 2-3 liters continuous oxygen supplied by [employer].  Pt has BIPAP in the home - pt was not utilizing BIPAP PTA due to inability to wear mask.  Wife to contact [employer] (provider for BIPAP) and request equipment assessment).  Wife requested resources for residential housing assistance - family is being forced to vacate home in the near future - CSW consulted.  CM will continue to follow for discharge needs    Expected Discharge Date:                  Expected Discharge Plan:     In-House Referral:     Discharge planning Services  CM Consult  Post Acute Care Choice:    Choice offered to:  Spouse  DME Arranged:  Oxygen DME Agency[facility]  HH Arranged:    HH Agency:     Status of Service:  In process, will continue to follow  If discussed at Long Length of Stay Meetings, dates discussed:    Additional Comments:  Cherylann ParrClaxton, Lorcan Shelp S, RN 11/14/2016, 12:28 PM

## 2016-11-14 NOTE — Progress Notes (Signed)
Nutrition Consult / Follow-up  DOCUMENTATION CODES:   Morbid obesity  INTERVENTION:    Vital High Protein at 70 ml/h (1680 ml per day)  Pro-stat 30 ml BID  Provides 1880 kcal, 177 gm protein, 1404 ml free water daily  NUTRITION DIAGNOSIS:   Inadequate oral intake related to inability to eat as evidenced by NPO status.  Ongoing  GOAL:   Provide needs based on ASPEN/SCCM guidelines  Met with TF  MONITOR:   Vent status, TF tolerance, Labs, I & O's  REASON FOR ASSESSMENT:   Consult Enteral/tube feeding initiation and management  ASSESSMENT:   56 y/o man with a PMH of COPD. Initially admitted [facility] 1/[facility] with SOB thought r/t LLL PNA, treated with bipap.  Improved initially but developed worsening resp status as well as AKI and hyperkalemia and was tx to [facility] 2/2. Intubated on 2/3.  Discussed patient with RN today. Remains on CRRT. Received MD Consult to resume TF. TF off since 0700 on 2/6 due to constipation; retroperitoneal bleed was ruled out. TF being resumed today. Patient remains intubated on ventilator support MV: 9.8 L/min Temp (24hrs), Avg:98.8 F (37.1 C), Min:97.4 F (36.3 C), Max:99.8 F (37.7 C)  Labs reviewed potassium, magnesium, phosphorus WNL Medications reviewed.  Diet Order:  Diet NPO time specified  Skin:  Reviewed, no issues  Last BM:  2/4  Height:   Ht Readings from Last 1 Encounters:  12/04/2016 _0  (1.727 m)    Weight:   Wt Readings from Last 1 Encounters:  11/14/16 (!) 326 lb 11.6 oz (148.2 kg)    Ideal Body Weight:  70 kg  BMI:  Body mass index is 49.68 kg/m.  Estimated Nutritional Needs:   Kcal:  8159-4707  Protein:  175 gm  Fluid:  2.1 L  EDUCATION NEEDS:   No education needs identified at this time  Molli Barrows, [place], [place], [place] Pager [phone] After Hours Pager [phone]

## 2016-11-14 NOTE — Progress Notes (Signed)
Assessment/Plan: 1 AKI oliguric ATN, fluid removal as tolerated via CRRT, no heparin 2 COPD per CCM 3 VDRF  4 obesity 5 ABLA, stable 6 Poss pneumonia 7 Shock, on pressor    Subjective: Interval History: Shock better, bleeding stopped  Objective: Vital signs in last 24 hours: Temp:  [98 F (36.7 C)-99.8 F (37.7 C)] 98 F (36.7 C) (02/07 0816) Pulse Rate:  [40-172] 65 (02/07 0900) Resp:  [23-35] 26 (02/07 0900) BP: (96-122)/(52-62) 121/57 (02/07 0012) SpO2:  [84 %-100 %] 99 % (02/07 0900) Arterial Line BP: (70-301)/(38-88) 128/51 (02/07 0900) FiO2 (%):  [50 %-60 %] 50 % (02/07 0755) Weight:  [148.2 kg (326 lb 11.6 oz)] 148.2 kg (326 lb 11.6 oz) (02/07 0324) Weight change: 10.1 kg (22 lb 4.3 oz)  Intake/Output from previous day: 02/06 0701 - 02/07 0700 In: 7296.4 [I.V.:5120.4; Blood:1489; NG/GT:180; IV [id number]:417] Out: 1086 [Urine:385] Intake/Output this shift: Total I/O In: 408.1 [I.V.:408.1] Out: 340 [Urine:25; Other:315]  General appearance: unresponsive GI: protuberant Extremities: anasarca  Lab Results:  Recent Labs  11/14/16 0345 11/14/16 0730  WBC 17.5* 16.7*  HGB 7.4* 7.3*  HCT 21.7* 21.5*  PLT 87* 69*   BMET:  Recent Labs  11/13/16 1522 11/14/16 0345  NA 137 139  K 3.6 3.7  CL 103 102  CO2 27 29  GLUCOSE 243* 192*  BUN 34* 33*  CREATININE 1.59* 1.69*  CALCIUM 7.1* 7.0*   No results for input(s): PTH in the last 72 hours. Iron Studies: No results for input(s): IRON, TIBC, TRANSFERRIN, FERRITIN in the last 72 hours. Studies/Results: Ct Abdomen Pelvis Wo Contrast  Result Date: 11/13/2016 CLINICAL DATA:  Dropping hemoglobin.  Assess for slight blood loss. EXAM: CT CHEST, ABDOMEN AND PELVIS WITHOUT CONTRAST TECHNIQUE: Multidetector CT imaging of the chest, abdomen and pelvis was performed following the standard protocol without IV contrast. COMPARISON:  03/14/2015 FINDINGS: CT CHEST FINDINGS Chest wall: No chest wall mass, hematoma,  supraclavicular or axillary adenopathy. The thyroid gland is grossly normal. Cardiovascular: The heart is normal in size. No pericardial effusion. The aorta is normal in caliber. Scattered atherosclerotic calcifications. Central lines are noted bilaterally. No complicating features. Mediastinum/Nodes: Calcified mediastinal and hilar lymph nodes. No mass, adenopathy or hematoma. The esophagus contains a NG tube. Head endotracheal tube is noted in the trachea. Lungs/Pleura: No pulmonary edema. Small bilateral pleural effusions and right lower lobe atelectasis or infiltrate. Numerous calcified granulomas are noted. No worrisome pulmonary lesions. Musculoskeletal: No significant bony findings. CT ABDOMEN PELVIS FINDINGS Hepatobiliary: No focal hepatic lesions or intrahepatic biliary dilatation. Pancreas: No mass, inflammation or ductal dilatation. Spleen: Scattered calcified granulomas are noted throughout the spleen. Adrenals/Urinary Tract: The adrenal glands are normal. Lower pole right renal calculus. No hydronephrosis or obstructing ureteral calculi. No renal or bladder mass lesions. The bladder contains a Foley catheter. Stomach/Bowel: The stomach, duodenum, small bowel and colon are grossly normal without oral contrast. No inflammatory changes, mass lesions or obstructive findings. The terminal ileum is normal. The appendix is normal. The tip of the NG tube is in the descending duodenum. Vascular/Lymphatic: Scattered atherosclerotic calcifications involving the aorta and iliac arteries. No abdominal/pelvic lymphadenopathy or mass. Reproductive: The prostate gland and seminal vesicles are unremarkable. Other: There appears to be an arterial line in the right femoral artery. There is marked diffuse subcutaneous and intra and inter muscular edema, fluid and hematoma involving the right upper thigh. This also involves the scrotum. I would be worried that the arterial line could be leaking. Musculoskeletal: No  significant bony findings. IMPRESSION: 1. Large amount of fluid/edema/blood in the right upper thigh possibly from a leaking arterial line or possibly from a recent procedure. Recommend clinical correlation. 2. No significant intraabdominal/intrapelvic findings. 3. Small bilateral pleural effusions and right lower lobe atelectasis or clearing infiltrate. These results will be called to the ordering clinician or representative by the Radiologist Assistant, and communication documented in the PACS or zVision Dashboard. Electronically Signed   By: Marijo Sanes M.D.   On: 11/13/2016 11:29   Ct Head Wo Contrast  Result Date: 11/13/2016 CLINICAL DATA:  Low hemoglobin, altered mental status EXAM: CT HEAD WITHOUT CONTRAST TECHNIQUE: Contiguous axial images were obtained from the base of the skull through the vertex without intravenous contrast. COMPARISON:  None. FINDINGS: Brain: The ventricles are normal in size and configuration and the septum is in a normal midline position. The fourth ventricle basilar cisterns are unremarkable. No hemorrhage, mass lesion, or acute infarction is seen. Vascular: On this unenhanced study, no vascular abnormality is noted. Skull: On bone window images, no calvarial abnormality is seen. Sinuses/Orbits: There are probable retention cyst in the floor of both maxillary sinuses. Some mucosal thickening is present throughout ethmoid air cells. There be a small amount of fluid in the maxillary sinuses as well and maxillary sinusitis is a definite consideration. Endotracheal tube is noted. Other: None. IMPRESSION: 1. No acute intracranial abnormality. 2. Probable maxillary sinus disease. 3. Endotracheal tube present. Electronically Signed   By: Ivar Drape M.D.   On: 11/13/2016 11:09   Ct Chest Wo Contrast  Result Date: 11/13/2016 CLINICAL DATA:  Dropping hemoglobin.  Assess for slight blood loss. EXAM: CT CHEST, ABDOMEN AND PELVIS WITHOUT CONTRAST TECHNIQUE: Multidetector CT imaging of the  chest, abdomen and pelvis was performed following the standard protocol without IV contrast. COMPARISON:  03/14/2015 FINDINGS: CT CHEST FINDINGS Chest wall: No chest wall mass, hematoma, supraclavicular or axillary adenopathy. The thyroid gland is grossly normal. Cardiovascular: The heart is normal in size. No pericardial effusion. The aorta is normal in caliber. Scattered atherosclerotic calcifications. Central lines are noted bilaterally. No complicating features. Mediastinum/Nodes: Calcified mediastinal and hilar lymph nodes. No mass, adenopathy or hematoma. The esophagus contains a NG tube. Head endotracheal tube is noted in the trachea. Lungs/Pleura: No pulmonary edema. Small bilateral pleural effusions and right lower lobe atelectasis or infiltrate. Numerous calcified granulomas are noted. No worrisome pulmonary lesions. Musculoskeletal: No significant bony findings. CT ABDOMEN PELVIS FINDINGS Hepatobiliary: No focal hepatic lesions or intrahepatic biliary dilatation. Pancreas: No mass, inflammation or ductal dilatation. Spleen: Scattered calcified granulomas are noted throughout the spleen. Adrenals/Urinary Tract: The adrenal glands are normal. Lower pole right renal calculus. No hydronephrosis or obstructing ureteral calculi. No renal or bladder mass lesions. The bladder contains a Foley catheter. Stomach/Bowel: The stomach, duodenum, small bowel and colon are grossly normal without oral contrast. No inflammatory changes, mass lesions or obstructive findings. The terminal ileum is normal. The appendix is normal. The tip of the NG tube is in the descending duodenum. Vascular/Lymphatic: Scattered atherosclerotic calcifications involving the aorta and iliac arteries. No abdominal/pelvic lymphadenopathy or mass. Reproductive: The prostate gland and seminal vesicles are unremarkable. Other: There appears to be an arterial line in the right femoral artery. There is marked diffuse subcutaneous and intra and inter  muscular edema, fluid and hematoma involving the right upper thigh. This also involves the scrotum. I would be worried that the arterial line could be leaking. Musculoskeletal: No significant bony findings. IMPRESSION: 1. Large amount of  fluid/edema/blood in the right upper thigh possibly from a leaking arterial line or possibly from a recent procedure. Recommend clinical correlation. 2. No significant intraabdominal/intrapelvic findings. 3. Small bilateral pleural effusions and right lower lobe atelectasis or clearing infiltrate. These results will be called to the ordering clinician or representative by the Radiologist Assistant, and communication documented in the PACS or zVision Dashboard. Electronically Signed   By: Marijo Sanes M.D.   On: 11/13/2016 11:29   Dg Chest Port 1 View  Result Date: 11/14/2016 CLINICAL DATA:  On ventilator, pulmonary edema EXAM: PORTABLE CHEST 1 VIEW COMPARISON:  CT chest of 11/13/2016 and portable chest x-ray of 11/12/2016 FINDINGS: The tip of the endotracheal tube is approximately 5.1 cm above the carina. The lungs remain suboptimally aerated with bibasilar opacities right greater than left. These opacities may represent atelectasis and probable small right effusion, but developing pneumonia cannot be excluded. Right central venous line tip overlies the upper SVC and left IJ central venous line tip overlies the upper SVC as well. NG tube is present. Heart size is stable. IMPRESSION: 1. Tip of endotracheal tube approximately 5.1 cm above the carina. 2. Persistent bibasilar opacities right greater than left. Possible atelectasis and right effusion but cannot exclude pneumonia. Electronically Signed   By: Ivar Drape M.D.   On: 11/14/2016 09:53   Dg Chest Port 1 View  Result Date: 11/12/2016 CLINICAL DATA:  New left IJ central venous line placement EXAM: PORTABLE CHEST 1 VIEW COMPARISON:  1023 hours on the same day FINDINGS: New left IJ central venous line catheter is seen  projecting over the superior mediastinum, likely in distal left brachiocephalic vein possibly at the SVC juncture the heart and mediastinal contours are stable. Endotracheal tube tip is 6 cm above the carina. Gastric tube extends below the left hemidiaphragm in the expected location the stomach. Right IJ catheter is unchanged with distal tip in proximal SVC. Slight decrease in atelectasis at the right lung base. Small layering effusion is not entirely excluded. IMPRESSION: New left IJ central venous line catheter seen likely distal left brachiocephalic vein possibly at the SVC juncture. No pneumothorax. Slight decrease in right basilar atelectasis. Small layering right pleural effusion not excluded. Electronically Signed   By: Ashley Royalty M.D.   On: 11/12/2016 15:50   Dg Chest Port 1 View  Result Date: 11/12/2016 CLINICAL DATA:  Endotracheal tube position. EXAM: PORTABLE CHEST 1 VIEW COMPARISON:  Radiograph of November 11, 2016. FINDINGS: Stable cardiomediastinal silhouette. Endotracheal and nasogastric tubes are unchanged in position. Right internal jugular catheter is also unchanged with distal tip in expected position of SVC. No pneumothorax is noted. Left lung is clear. Stable right basilar opacity is noted concerning for atelectasis or infiltrate with possible associated pleural effusion. Bony thorax is unremarkable. IMPRESSION: Stable support apparatus. Stable right basilar opacity as described above. Electronically Signed   By: Marijo Conception, M.D.   On: 11/12/2016 10:33    Scheduled: . artificial tears  1 application Both Eyes K1S  . budesonide (PULMICORT) nebulizer solution  0.5 mg Nebulization BID  . chlorhexidine gluconate (MEDLINE KIT)  15 mL Mouth Rinse BID  . famotidine (PEPCID) IV  20 mg Intravenous Daily  . feeding supplement (PRO-STAT SUGAR FREE 64)  30 mL Per Tube BID  . feeding supplement (VITAL HIGH PROTEIN)  1,000 mL Per Tube Q24H  . hydrocortisone sod succinate (SOLU-CORTEF) inj   100 mg Intravenous Q8H  . ipratropium-albuterol  3 mL Nebulization Q6H  . mouth rinse  15 mL Mouth Rinse QID  . meropenem (MERREM) IV  1 g Intravenous Q12H  . oseltamivir  75 mg Oral BID      LOS: 5 days   Henry Watts C 11/14/2016,9:59 AM

## 2016-11-14 NOTE — Progress Notes (Signed)
eLink Physician-Brief Progress Note Patient Name: Consuella LoseJohn R Billet DOB: 07/28/1961 MRN: [id number]   Date of Service  11/14/2016  HPI/Events of Note  Hb 6.8 Pt with blood loss from rt groin bleed, repeated clotting of CVVH filter  eICU Interventions  Transfuse 1 unit PRBC     Intervention Category Major Interventions: Other:  Bailie Christenbury 11/14/2016, 12:34 AM

## 2016-11-15 ENCOUNTER — Inpatient Hospital Stay (HOSPITAL_COMMUNITY): Payer: Medicare Other

## 2016-11-15 DIAGNOSIS — R58 Hemorrhage, not elsewhere classified: Secondary | ICD-10-CM

## 2016-11-15 LAB — RENAL FUNCTION PANEL
ALBUMIN: 1.8 g/dL — AB (ref 3.5–5.0)
Anion gap: 8 (ref 5–15)
BUN: 35 mg/dL — AB (ref 6–20)
CO2: 29 mmol/L (ref 22–32)
Calcium: 7.7 mg/dL — ABNORMAL LOW (ref 8.9–10.3)
Chloride: 103 mmol/L (ref 101–111)
Creatinine, Ser: 1.17 mg/dL (ref 0.61–1.24)
GFR calc Af Amer: >60 mL/min (ref >60)
GLUCOSE: 143 mg/dL — AB (ref 65–99)
PHOSPHORUS: 3 mg/dL (ref 2.5–4.6)
POTASSIUM: 4.2 mmol/L (ref 3.5–5.1)
Sodium: 140 mmol/L (ref 135–145)

## 2016-11-15 LAB — COMPREHENSIVE METABOLIC PANEL
ALBUMIN: 1.7 g/dL — AB (ref 3.5–5.0)
ALT: 70 U/L — ABNORMAL HIGH (ref 17–63)
ANION GAP: 6 (ref 5–15)
AST: 28 U/L (ref 15–41)
Alkaline Phosphatase: 35 U/L — ABNORMAL LOW (ref 38–126)
BILIRUBIN TOTAL: 0.2 mg/dL — AB (ref 0.3–1.2)
BUN: 27 mg/dL — AB (ref 6–20)
CO2: 30 mmol/L (ref 22–32)
Calcium: 7.3 mg/dL — ABNORMAL LOW (ref 8.9–10.3)
Chloride: 103 mmol/L (ref 101–111)
Creatinine, Ser: 0.81 mg/dL (ref 0.61–1.24)
GFR calc Af Amer: >60 mL/min (ref >60)
GFR calc non Af Amer: >60 mL/min (ref >60)
GLUCOSE: 182 mg/dL — AB (ref 65–99)
POTASSIUM: 3.5 mmol/L (ref 3.5–5.1)
Sodium: 139 mmol/L (ref 135–145)
TOTAL PROTEIN: 3.9 g/dL — AB (ref 6.5–8.1)

## 2016-11-15 LAB — GLUCOSE, CAPILLARY
GLUCOSE-CAPILLARY: 173 mg/dL — AB (ref 65–99)
GLUCOSE-CAPILLARY: 150 mg/dL — AB (ref 65–99)
GLUCOSE-CAPILLARY: 160 mg/dL — AB (ref 65–99)
GLUCOSE-CAPILLARY: 129 mg/dL — AB (ref 65–99)
Glucose-Capillary: 172 mg/dL — ABNORMAL HIGH (ref 65–99)
Glucose-Capillary: 179 mg/dL — ABNORMAL HIGH (ref 65–99)

## 2016-11-15 LAB — CBC
HEMATOCRIT: 19.6 % — AB (ref 39.0–52.0)
HEMATOCRIT: 21.7 % — AB (ref 39.0–52.0)
HEMOGLOBIN: 7.1 g/dL — AB (ref 13.0–17.0)
Hemoglobin: 6.5 g/dL — CL (ref 13.0–17.0)
MCH: 28 pg (ref 26.0–34.0)
MCH: 28 pg (ref 26.0–34.0)
MCHC: 33.2 g/dL (ref 30.0–36.0)
MCHC: 32.7 g/dL (ref 30.0–36.0)
MCV: 84.5 fL (ref 78.0–100.0)
MCV: 85.4 fL (ref 78.0–100.0)
Platelets: 75 10*3/uL — ABNORMAL LOW (ref 150–400)
Platelets: 75 10*3/uL — ABNORMAL LOW (ref 150–400)
RBC: 2.32 MIL/uL — ABNORMAL LOW (ref 4.22–5.81)
RBC: 2.54 MIL/uL — AB (ref 4.22–5.81)
RDW: 16.5 % — AB (ref 11.5–15.5)
RDW: 16.5 % — ABNORMAL HIGH (ref 11.5–15.5)
WBC: 18.5 10*3/uL — AB (ref 4.0–10.5)
WBC: 21 10*3/uL — ABNORMAL HIGH (ref 4.0–10.5)

## 2016-11-15 LAB — BLOOD GAS, ARTERIAL
ACID-BASE EXCESS: 5.2 mmol/L — AB (ref 0.0–2.0)
Bicarbonate: 31 mmol/L — ABNORMAL HIGH (ref 20.0–28.0)
DRAWN BY: 249101
FIO2: 40
MECHVT: 550 mL
O2 SAT: 96.3 %
PCO2 ART: 59.7 mmHg — AB (ref 32.0–48.0)
PEEP/CPAP: 5 cmH2O
PH ART: 7.331 — AB (ref 7.350–7.450)
PO2 ART: 81.3 mmHg — AB (ref 83.0–108.0)
Patient temperature: 97.3
RATE: 18 resp/min

## 2016-11-15 LAB — PHOSPHORUS
PHOSPHORUS: 2.9 mg/dL (ref 2.5–4.6)
Phosphorus: 2.9 mg/dL (ref 2.5–4.6)

## 2016-11-15 LAB — CULTURE, RESPIRATORY W GRAM STAIN: Special Requests: NORMAL

## 2016-11-15 LAB — APTT: APTT: 27 s (ref 24–36)

## 2016-11-15 LAB — CULTURE, RESPIRATORY: CULTURE: NORMAL

## 2016-11-15 LAB — PROCALCITONIN: Procalcitonin: 0.35 ng/mL

## 2016-11-15 LAB — PREPARE RBC (CROSSMATCH)

## 2016-11-15 LAB — MAGNESIUM: MAGNESIUM: 2.2 mg/dL (ref 1.7–2.4)

## 2016-11-15 MED ORDER — SODIUM CHLORIDE 0.9 % IV SOLN
30.0000 meq | Freq: Once | INTRAVENOUS | Status: AC
  Administered 2016-11-15: 30 meq via INTRAVENOUS
  Filled 2016-11-15: qty 15

## 2016-11-15 MED ORDER — MIDAZOLAM HCL 2 MG/2ML IJ SOLN
2.0000 mg | INTRAMUSCULAR | Status: AC | PRN
  Administered 2016-11-16 (×3): 2 mg via INTRAVENOUS
  Filled 2016-11-15 (×3): qty 2

## 2016-11-15 MED ORDER — DEXTROSE 5 % IV SOLN
0.0000 ug/min | INTRAVENOUS | Status: DC
  Administered 2016-11-17 – 2016-11-20 (×2): 5 ug/min via INTRAVENOUS
  Filled 2016-11-15 (×3): qty 16

## 2016-11-15 MED ORDER — SODIUM CHLORIDE 0.9% FLUSH
10.0000 mL | Freq: Two times a day (BID) | INTRAVENOUS | Status: DC
Start: 2016-11-15 — End: 2016-11-22
  Administered 2016-11-15: 10 mL
  Administered 2016-11-16: 30 mL
  Administered 2016-11-16: 10 mL
  Administered 2016-11-17: 20 mL
  Administered 2016-11-18 – 2016-11-21 (×5): 10 mL

## 2016-11-15 MED ORDER — HEPARIN SODIUM (PORCINE) 1000 UNIT/ML DIALYSIS
1000.0000 [IU] | INTRAMUSCULAR | Status: DC | PRN
  Filled 2016-11-15: qty 6

## 2016-11-15 MED ORDER — ALTEPLASE 2 MG IJ SOLR
2.0000 mg | Freq: Once | INTRAMUSCULAR | Status: AC
  Administered 2016-11-15: 2 mg
  Filled 2016-11-15: qty 2

## 2016-11-15 MED ORDER — SODIUM CHLORIDE 0.9 % IV SOLN
Freq: Once | INTRAVENOUS | Status: AC
  Administered 2016-11-15: 10 mL/h via INTRAVENOUS

## 2016-11-15 MED ORDER — SODIUM CHLORIDE 0.9% FLUSH: 10.0000 mL | INTRAVENOUS | Status: DC | PRN

## 2016-11-15 MED ORDER — LEVOFLOXACIN IN D5W 750 MG/150ML IV SOLN
750.0000 mg | INTRAVENOUS | Status: DC
Start: 2016-11-15 — End: 2016-11-18
  Administered 2016-11-15 – 2016-11-18 (×4): 750 mg via INTRAVENOUS
  Filled 2016-11-15 (×4): qty 150

## 2016-11-15 MED ORDER — FUROSEMIDE 10 MG/ML IJ SOLN
100.0000 mg | Freq: Once | INTRAVENOUS | Status: AC
  Administered 2016-11-15: 100 mg via INTRAVENOUS
  Filled 2016-11-15: qty 10

## 2016-11-15 MED ORDER — POLYETHYLENE GLYCOL 3350 17 G PO PACK
17.0000 g | PACK | Freq: Every day | ORAL | Status: DC
  Administered 2016-11-15 – 2016-11-18 (×3): 17 g via ORAL
  Filled 2016-11-15 (×5): qty 1

## 2016-11-15 MED ORDER — CHLORHEXIDINE GLUCONATE CLOTH 2 % EX PADS
6.0000 | MEDICATED_PAD | Freq: Every day | CUTANEOUS | Status: DC
  Administered 2016-11-15 – 2016-11-21 (×7): 6 via TOPICAL

## 2016-11-15 MED ORDER — NOREPINEPHRINE BITARTRATE 1 MG/ML IV SOLN
0.0000 ug/min | INTRAVENOUS | Status: DC
  Filled 2016-11-15: qty 4

## 2016-11-15 MED ORDER — PRISMASOL BGK 4/2.5 32-4-2.5 MEQ/L IV SOLN
INTRAVENOUS | Status: DC
  Administered 2016-11-15 – 2016-11-21 (×32): via INTRAVENOUS_CENTRAL
  Filled 2016-11-15 (×49): qty 5000

## 2016-11-15 MED ORDER — BISACODYL 10 MG RE SUPP
10.0000 mg | Freq: Once | RECTAL | Status: AC
  Administered 2016-11-15: 10 mg via RECTAL
  Filled 2016-11-15: qty 1

## 2016-11-15 MED ORDER — HEPARIN SODIUM (PORCINE) 1000 UNIT/ML DIALYSIS: 1000.0000 [IU] | INTRAMUSCULAR | Status: DC | PRN

## 2016-11-15 MED ORDER — MIDAZOLAM HCL 2 MG/2ML IJ SOLN
2.0000 mg | INTRAMUSCULAR | Status: DC | PRN
  Administered 2016-11-16 – 2016-11-17 (×5): 2 mg via INTRAVENOUS
  Filled 2016-11-15 (×6): qty 2

## 2016-11-15 MED ORDER — SODIUM CHLORIDE 0.9 % IV BOLUS (SEPSIS)
500.0000 mL | Freq: Once | INTRAVENOUS | Status: AC
Start: 2016-11-15 — End: 2016-11-15
  Administered 2016-11-15: 500 mL via INTRAVENOUS

## 2016-11-15 NOTE — Progress Notes (Signed)
    Hypotensive after sedatin established per RN Off CRRT 60% fio2, peep 12 SBP 70, MAP40s  Plan 500cc fluid bolus -> if fails restart pressors  Dr. Kalman ShanMurali Dorrine Montone, M.D., [facility].C.C.P Pulmonary and Critical Care Medicine Staff Physician [facility] [place] [facility] Pager: [phone], If no answer or between  15:00h - 7:00h: call [phone]  11/15/2016 6:49 PM

## 2016-11-15 NOTE — Progress Notes (Signed)
Assessment/Plan: 1 AKI oliguric ATN, fluid removal as tolerated via CRRT, no heparin---now nonoliguric, give lasix, increase K bath, give K 2 COPD per CCM 3 VDRF  4 obesity 5 ABLA, stable 6 Poss pneumonia 7 Shock improved, off pressors   Subjective: Interval History: More stable  Objective: Vital signs in last 24 hours: Temp:  [97.3 F (36.3 C)-97.5 F (36.4 C)] 97.4 F (36.3 C) (02/08 0744) Pulse Rate:  [58-76] 73 (02/08 0930) Resp:  [7-30] 21 (02/08 0930) BP: (110-136)/(43-49) 117/49 (02/08 0737) SpO2:  [91 %-100 %] 96 % (02/08 0930) Arterial Line BP: (102-144)/(42-56) 105/47 (02/08 0930) FiO2 (%):  [40 %] 40 % (02/08 0800) Weight:  [146.8 kg (323 lb 10.2 oz)] 146.8 kg (323 lb 10.2 oz) (02/08 0209) Weight change: -1.4 kg (-3 lb 1.4 oz)  Intake/Output from previous day: 02/07 0701 - 02/08 0700 In: 3338.6 [I.V.:1888.6; NG/GT:1350; IV Piggyback:100] Out: 4863 [Urine:665; Emesis/NG output:90] Intake/Output this shift: Total I/O In: 205 [I.V.:35; NG/GT:170] Out: 154 [Urine:45; Other:109]  General appearance: not responsive Head: Normocephalic, without obvious abnormality, atraumatic, big swollen neck GI: protuberant abd  Lab Results:  Recent Labs  11/15/16 0227 11/15/16 0812  WBC 18.5* 21.0*  HGB 6.5* 7.1*  HCT 19.6* 21.7*  PLT 75* 75*   BMET:  Recent Labs  11/14/16 1628 11/15/16 0227  NA 139 139  K 3.6 3.5  CL 105 103  CO2 29 30  GLUCOSE 164* 182*  BUN 29* 27*  CREATININE 1.13 0.81  CALCIUM 7.3* 7.3*   No results for input(s): PTH in the last 72 hours. Iron Studies: No results for input(s): IRON, TIBC, TRANSFERRIN, FERRITIN in the last 72 hours. Studies/Results: Ct Abdomen Pelvis Wo Contrast  Result Date: 11/13/2016 CLINICAL DATA:  Dropping hemoglobin.  Assess for slight blood loss. EXAM: CT CHEST, ABDOMEN AND PELVIS WITHOUT CONTRAST TECHNIQUE: Multidetector CT imaging of the chest, abdomen and pelvis was performed following the standard protocol  without IV contrast. COMPARISON:  03/14/2015 FINDINGS: CT CHEST FINDINGS Chest wall: No chest wall mass, hematoma, supraclavicular or axillary adenopathy. The thyroid gland is grossly normal. Cardiovascular: The heart is normal in size. No pericardial effusion. The aorta is normal in caliber. Scattered atherosclerotic calcifications. Central lines are noted bilaterally. No complicating features. Mediastinum/Nodes: Calcified mediastinal and hilar lymph nodes. No mass, adenopathy or hematoma. The esophagus contains a NG tube. Head endotracheal tube is noted in the trachea. Lungs/Pleura: No pulmonary edema. Small bilateral pleural effusions and right lower lobe atelectasis or infiltrate. Numerous calcified granulomas are noted. No worrisome pulmonary lesions. Musculoskeletal: No significant bony findings. CT ABDOMEN PELVIS FINDINGS Hepatobiliary: No focal hepatic lesions or intrahepatic biliary dilatation. Pancreas: No mass, inflammation or ductal dilatation. Spleen: Scattered calcified granulomas are noted throughout the spleen. Adrenals/Urinary Tract: The adrenal glands are normal. Lower pole right renal calculus. No hydronephrosis or obstructing ureteral calculi. No renal or bladder mass lesions. The bladder contains a Foley catheter. Stomach/Bowel: The stomach, duodenum, small bowel and colon are grossly normal without oral contrast. No inflammatory changes, mass lesions or obstructive findings. The terminal ileum is normal. The appendix is normal. The tip of the NG tube is in the descending duodenum. Vascular/Lymphatic: Scattered atherosclerotic calcifications involving the aorta and iliac arteries. No abdominal/pelvic lymphadenopathy or mass. Reproductive: The prostate gland and seminal vesicles are unremarkable. Other: There appears to be an arterial line in the right femoral artery. There is marked diffuse subcutaneous and intra and inter muscular edema, fluid and hematoma involving the right upper thigh. This  also involves the scrotum. I would be worried that the arterial line could be leaking. Musculoskeletal: No significant bony findings. IMPRESSION: 1. Large amount of fluid/edema/blood in the right upper thigh possibly from a leaking arterial line or possibly from a recent procedure. Recommend clinical correlation. 2. No significant intraabdominal/intrapelvic findings. 3. Small bilateral pleural effusions and right lower lobe atelectasis or clearing infiltrate. These results will be called to the ordering clinician or representative by the Radiologist Assistant, and communication documented in the PACS or zVision Dashboard. Electronically Signed   By: Marijo Sanes M.D.   On: 11/13/2016 11:29   Ct Head Wo Contrast  Result Date: 11/13/2016 CLINICAL DATA:  Low hemoglobin, altered mental status EXAM: CT HEAD WITHOUT CONTRAST TECHNIQUE: Contiguous axial images were obtained from the base of the skull through the vertex without intravenous contrast. COMPARISON:  None. FINDINGS: Brain: The ventricles are normal in size and configuration and the septum is in a normal midline position. The fourth ventricle basilar cisterns are unremarkable. No hemorrhage, mass lesion, or acute infarction is seen. Vascular: On this unenhanced study, no vascular abnormality is noted. Skull: On bone window images, no calvarial abnormality is seen. Sinuses/Orbits: There are probable retention cyst in the floor of both maxillary sinuses. Some mucosal thickening is present throughout ethmoid air cells. There be a small amount of fluid in the maxillary sinuses as well and maxillary sinusitis is a definite consideration. Endotracheal tube is noted. Other: None. IMPRESSION: 1. No acute intracranial abnormality. 2. Probable maxillary sinus disease. 3. Endotracheal tube present. Electronically Signed   By: Ivar Drape M.D.   On: 11/13/2016 11:09   Ct Chest Wo Contrast  Result Date: 11/13/2016 CLINICAL DATA:  Dropping hemoglobin.  Assess for slight  blood loss. EXAM: CT CHEST, ABDOMEN AND PELVIS WITHOUT CONTRAST TECHNIQUE: Multidetector CT imaging of the chest, abdomen and pelvis was performed following the standard protocol without IV contrast. COMPARISON:  03/14/2015 FINDINGS: CT CHEST FINDINGS Chest wall: No chest wall mass, hematoma, supraclavicular or axillary adenopathy. The thyroid gland is grossly normal. Cardiovascular: The heart is normal in size. No pericardial effusion. The aorta is normal in caliber. Scattered atherosclerotic calcifications. Central lines are noted bilaterally. No complicating features. Mediastinum/Nodes: Calcified mediastinal and hilar lymph nodes. No mass, adenopathy or hematoma. The esophagus contains a NG tube. Head endotracheal tube is noted in the trachea. Lungs/Pleura: No pulmonary edema. Small bilateral pleural effusions and right lower lobe atelectasis or infiltrate. Numerous calcified granulomas are noted. No worrisome pulmonary lesions. Musculoskeletal: No significant bony findings. CT ABDOMEN PELVIS FINDINGS Hepatobiliary: No focal hepatic lesions or intrahepatic biliary dilatation. Pancreas: No mass, inflammation or ductal dilatation. Spleen: Scattered calcified granulomas are noted throughout the spleen. Adrenals/Urinary Tract: The adrenal glands are normal. Lower pole right renal calculus. No hydronephrosis or obstructing ureteral calculi. No renal or bladder mass lesions. The bladder contains a Foley catheter. Stomach/Bowel: The stomach, duodenum, small bowel and colon are grossly normal without oral contrast. No inflammatory changes, mass lesions or obstructive findings. The terminal ileum is normal. The appendix is normal. The tip of the NG tube is in the descending duodenum. Vascular/Lymphatic: Scattered atherosclerotic calcifications involving the aorta and iliac arteries. No abdominal/pelvic lymphadenopathy or mass. Reproductive: The prostate gland and seminal vesicles are unremarkable. Other: There appears to  be an arterial line in the right femoral artery. There is marked diffuse subcutaneous and intra and inter muscular edema, fluid and hematoma involving the right upper thigh. This also involves the scrotum. I would be worried  that the arterial line could be leaking. Musculoskeletal: No significant bony findings. IMPRESSION: 1. Large amount of fluid/edema/blood in the right upper thigh possibly from a leaking arterial line or possibly from a recent procedure. Recommend clinical correlation. 2. No significant intraabdominal/intrapelvic findings. 3. Small bilateral pleural effusions and right lower lobe atelectasis or clearing infiltrate. These results will be called to the ordering clinician or representative by the Radiologist Assistant, and communication documented in the PACS or zVision Dashboard. Electronically Signed   By: Marijo Sanes M.D.   On: 11/13/2016 11:29   Dg Chest Port 1 View  Result Date: 11/15/2016 CLINICAL DATA:  Pulmonary edema EXAM: PORTABLE CHEST 1 VIEW COMPARISON:  11/14/2016 FINDINGS: Cardiac shadow is stable. Endotracheal tube, nasogastric catheter and bilateral jugular central lines are again seen and stable. Lungs are well aerated bilaterally. Bibasilar opacities are again identified. Generalized increased density is noted over the left chest likely related to a posterior fusion. Small right effusion is noted. IMPRESSION: Stable bibasilar densities. Tubes and lines as described above. Changes consistent with bilateral pleural effusions. Electronically Signed   By: Inez Catalina M.D.   On: 11/15/2016 07:32   Dg Chest Port 1 View  Result Date: 11/14/2016 CLINICAL DATA:  On ventilator, pulmonary edema EXAM: PORTABLE CHEST 1 VIEW COMPARISON:  CT chest of 11/13/2016 and portable chest x-ray of 11/12/2016 FINDINGS: The tip of the endotracheal tube is approximately 5.1 cm above the carina. The lungs remain suboptimally aerated with bibasilar opacities right greater than left. These opacities  may represent atelectasis and probable small right effusion, but developing pneumonia cannot be excluded. Right central venous line tip overlies the upper SVC and left IJ central venous line tip overlies the upper SVC as well. NG tube is present. Heart size is stable. IMPRESSION: 1. Tip of endotracheal tube approximately 5.1 cm above the carina. 2. Persistent bibasilar opacities right greater than left. Possible atelectasis and right effusion but cannot exclude pneumonia. Electronically Signed   By: Ivar Drape M.D.   On: 11/14/2016 09:53    Scheduled: . artificial tears  1 application Both Eyes Q0G  . bisacodyl  10 mg Rectal Once  . budesonide (PULMICORT) nebulizer solution  0.5 mg Nebulization BID  . chlorhexidine gluconate (MEDLINE KIT)  15 mL Mouth Rinse BID  . famotidine (PEPCID) IV  20 mg Intravenous Daily  . feeding supplement (PRO-STAT SUGAR FREE 64)  30 mL Per Tube BID  . feeding supplement (VITAL HIGH PROTEIN)  1,000 mL Per Tube Q24H  . hydrocortisone sod succinate (SOLU-CORTEF) inj  100 mg Intravenous Q8H  . ipratropium-albuterol  3 mL Nebulization Q6H  . levofloxacin (LEVAQUIN) IV  750 mg Intravenous Q24H  . mouth rinse  15 mL Mouth Rinse QID  . oseltamivir  75 mg Oral BID  . polyethylene glycol  17 g Oral Daily   Continuous: . amiodarone Stopped (11/15/16 8676)  . fentaNYL infusion INTRAVENOUS 100 mcg/hr (11/15/16 0800)  . Study Medication - Expanded Acess for LJPC-501 Stopped (11/15/16 0800)  . dialysate (PRISMASATE) 2,000 mL/hr at 11/15/16 0953  . dialysis replacement fluid (prismasate) 300 mL/hr at 11/15/16 0617  . dialysis replacement fluid (prismasate) 300 mL/hr at 11/15/16 0345      LOS: 6 days   Tametra Ahart C 11/15/2016,9:54 AM

## 2016-11-15 NOTE — Progress Notes (Signed)
   sbp 94, MAP 55, HR 91 after flud bolus  Plan Start levophed  Dr. Kalman ShanMurali Ceirra Belli, M.D., [facility] IncF.C.C.P Pulmonary and Critical Care Medicine Staff Physician [facility] [place] [facility] Pager: [phone], If no answer or between  15:00h - 7:00h: call [phone]  11/15/2016 8:16 PM

## 2016-11-15 NOTE — Progress Notes (Signed)
PULMONARY / CRITICAL CARE MEDICINE   Name: Henry Watts MRN: [id number] DOB: [birth year]    LOS: 7 days  HISTORY OF PRESENT ILLNESS:   56 y/o man with a PMH of COPD. Initially admitted to [facility] 1/31 with SOB thought r/t LLL PNA, treated with bipap.  Improved initially but developed worsening resp status as well as AKI and hyperkalemia and was tx to [facility] 2/2.     SUBJECTIVE:  Remains ventilated. Received additional 1U PRBC overnight for Hgb 6.5.  Off angiotensin 2 infusion and all pressors   VITAL SIGNS: BP (!) 119/49   Pulse 72   Temp 97.4 F (36.3 C) (Oral)   Resp (!) 27   Ht 5\' 8"  (1.727 m)   Wt (!) 323 lb 10.2 oz (146.8 kg)   SpO2 95%   BMI 49.21 kg/m   HEMODYNAMICS: CVP:  [8 mmHg-11 mmHg] 9 mmHg  VENTILATOR SETTINGS: Vent Mode: PRVC FiO2 (%):  [40 %-50 %] 40 % Set Rate:  [18 bmp-26 bmp] 18 bmp Vt Set:  [550 mL] 550 mL PEEP:  [5 cmH20] 5 cmH20 Plateau Pressure:  [15 cmH20-21 cmH20] 18 cmH20  INTAKE / OUTPUT: I/O last 3 completed shifts: In: 6642.5 [I.V.:4862.5; Blood:330; NG/GT:1350; IV Piggyback:100] Out: 5678 [Urine:875; Emesis/NG output:90; Other:4713]  PHYSICAL EXAMINATION: General:  Obese, chronically ill appearing male, NAD, sedated on vent  HEENT: MM pink/moist, ETT Neuro: sedated on vent, RASS -3, does not open eyes or respond to commands CV: RRR, no murmurs appreciated PULM: even/non-labored on vent, coarse breath sounds GI: rotund, mildly distended, hypoactive BM, non-tender Extremities: warm/dry, trace BLE edema  Skin: no rashes or lesions   LABS:  BMET  Recent Labs Lab 11/14/16 0345 11/14/16 1628 11/15/16 0227  NA 139 139 139  K 3.7 3.6 3.5  CL 102 105 103  CO2 29 29 30   BUN 33* 29* 27*  CREATININE 1.69* 1.13 0.81  GLUCOSE 192* 164* 182*    Electrolytes  Recent Labs Lab 11/14/16 0345 11/14/16 0940 11/14/16 1628 11/14/16 1630 11/15/16 0227  CALCIUM 7.0*  --  7.3*  --  7.3*  MG 2.2 2.2  --  2.2 2.2  PHOS 3.4 3.0 3.6  3.5 2.9    CBC  Recent Labs Lab 11/14/16 0730 11/14/16 1630 11/15/16 0227  WBC 16.7* 16.4* 18.5*  HGB 7.3* 7.1* 6.5*  HCT 21.5* 20.9* 19.6*  PLT 69* 80* 75*    Coag's  Recent Labs Lab 11/13/16 0447 11/13/16 1415 11/13/16 1522 11/14/16 0345 11/15/16 0227  APTT >200*  --  26 27 27   INR 1.59 1.31 1.34  --   --     Sepsis Markers  Recent Labs Lab 11/10/16 0735 11/10/16 2308  11/13/16 0842 11/14/16 0345 11/15/16 0227  LATICACIDVEN 2.3* 1.8  --  4.1*  --   --   PROCALCITON 1.32  --   < > 0.89 1.41 0.35  < > = values in this interval not displayed.  ABG  Recent Labs Lab 11/13/16 1622 11/14/16 0956 11/15/16 0155  PHART 7.348* 7.477* 7.331*  PCO2ART 49.1* 41.0 59.7*  PO2ART 59.0* 140.0* 81.3*    Liver Enzymes  Recent Labs Lab 11/13/16 0842  11/14/16 0345 11/14/16 1628 11/15/16 0227  AST 40  --  78*  --  28  ALT 46  --  98*  --  70*  ALKPHOS 26*  --  25*  --  35*  BILITOT 0.5  --  0.4  --  0.2*  ALBUMIN 1.7*  < >  1.6* 1.7* 1.7*  < > = values in this interval not displayed.  Cardiac Enzymes No results for input(s): TROPONINI, PROBNP in the last 168 hours.  Glucose  Recent Labs Lab 11/14/16 0814 11/14/16 1110 11/14/16 1512 11/14/16 2003 11/14/16 2351 11/15/16 0312  GLUCAP 145* 159* 165* 166* 172* 172*    Imaging Dg Chest Port 1 View  Result Date: 11/14/2016 CLINICAL DATA:  On ventilator, pulmonary edema EXAM: PORTABLE CHEST 1 VIEW COMPARISON:  CT chest of 11/13/2016 and portable chest x-ray of 11/12/2016 FINDINGS: The tip of the endotracheal tube is approximately 5.1 cm above the carina. The lungs remain suboptimally aerated with bibasilar opacities right greater than left. These opacities may represent atelectasis and probable small right effusion, but developing pneumonia cannot be excluded. Right central venous line tip overlies the upper SVC and left IJ central venous line tip overlies the upper SVC as well. NG tube is present. Heart size  is stable. IMPRESSION: 1. Tip of endotracheal tube approximately 5.1 cm above the carina. 2. Persistent bibasilar opacities right greater than left. Possible atelectasis and right effusion but cannot exclude pneumonia. Electronically Signed   By: Dwyane DeePaul  Barry M.D.   On: 11/14/2016 09:53   STUDIES:  Echo 2/5 - EF 55-60%, G1DD  CULTURES: MRSA 2/2 > (-) Blood culture and flu (-) at [place] RSV 2/4 >> POS Flu A  Blood 2/4 >> NGx3d Trach 2/4 >> pending  [facility]IBIOTICS: [facility]/4 >>>2/7 Meropenem 2/4 >>>2/8 Levofloxacin 2/8>>> Tamiflu 2/4>>> 2/8  SIGNIFICANT EVENTS: 1/31 admit at [place] for SOB. Rx as PNA [facility], resp fx 2/6 shock, some bleeding from rt fem a line (hematoma), 3 units prbc, ffp, refractory shock resolved by angiotensin infusion  LINES/TUBES: RIJ Temp HD Cath 2/4> LIJ CVC 2/5> R femoral art line 2/4>2/6 Left fem aline 2/6>>> ETT 2/4>  DISCUSSION: 56 y/o man with COPD exacerbation +/- pneumonia, developing renal and resp failure. [facility] Intubated. On CVVH.    ASSESSMENT / PLAN:  PULMONARY A: Acute on Chronic Hypoxemic Hypercapneic resp failure - multifactorial r/t Flu, AECOPD, PNA +/- untreated OSA  Hx OSA - noncompliant with CPAP  P:   Vent support   Pulmicort BID, Duoneb q6 Neg balance tolerated 1.4 liters ABg reviewed, keep same MV, goal is to SBT  CARDIAC A:  Sinus tachycardia Volume overload Shock - presume septic  Echo 2/6 - EF 65-70%, G1DD  A.fib with RVR - resolved P : Fluid removal as tolerated via CVVHD per renal Continue stress-dose steroids -- on chronic pred for COPD  Off amio - lower HR Now angiotensin II ([facility]JPC-501) infusion, could restart if needed Stress steroids  RENAL A:   AKI  Hyperkalemia - resolved.   P:   CVVHD per renal  AM BMP  INFECTIOUS A:   Concern for RLL PNA Flu A POS Procalcitonin WNL P:   Trach culture pending Trend  Pct further Narrow meropenem  GI:  Constipation CT abd 2/6 with no signs of retro bleed. P:  TF Pepcid  LFT wnl  HEME:  Leukocytosis - worsening.  Anemia - severe. Hematoma from R femoral line possibly contributing. Received additional 1U PRBC this AM.  Drop plat (conusmption, diltution) P:  Transfusion threshold <7 AM cbc SCDs Fluid removal should increase HCT  ENDOCRINE A: DM AI P: SSI  Stress steroids   NEUROLOGIC  A: Chronic Pain.  Anxiety Sedation needs on vent  P: RASS  goal: -1 Continue fentanyl gtt, versed gtt Vecuronium q2 PRN   Tarri Abernethy, MD, MPH PGY-2 [facility]  STAFF NOTE: I, Rory Percy, MD FACP have personally reviewed patient's available data, including medical history, events of note, physical examination and test results as part of my evaluation. I have discussed with resident/NP and other care providers such as pharmacist, RN and RRT. In addition, I personally evaluated patient and elicited key findings of: rass -3, not fc, ronchi left greater rt, abdo less distended, remains on CVVHD, pcxr slight worse left, for further neg balance and pcxr follow up, abg reviewed, keep sdame MV, WUA followed by SBT, upright position as able, continued same neg balance goals as last 24 hours, off all pressors and can restart angiotensin if needed as he was super responder, noted slight drop HCT, got 1 unit, hope neg balance will improve hct further, cbc in am , remains culture neg, narrow off meropenem The patient is critically ill with multiple organ systems failure and requires high complexity decision making for assessment and support, frequent evaluation and titration of therapies, application of advanced monitoring technologies and extensive interpretation of multiple databases.   Critical Care Time devoted to patient care services described in this note is 35 Minutes. This time reflects time of care of this signee: Rory Percy, MD FACP. This critical care time does not reflect procedure time, or teaching time or supervisory time of PA/NP/Med student/Med Resident etc but could involve care discussion time. Rest per NP/medical resident whose note is outlined above and that I agree with   Mcarthur Rossetti. Tyson Alias, MD, FACP Pgr: [phone] [place] 11/15/2016 9:20 AM

## 2016-11-15 NOTE — Progress Notes (Signed)
eLink Physician-Brief Progress Note Patient Name: Henry LoseJohn R Packett DOB: 12/24/1960 MRN: [id number]   Date of Service  11/15/2016  HPI/Events of Note  Hgb 6.5  eICU Interventions  One unit RBCs     Intervention Category Major Interventions: Hemorrhage - evaluation and management  Merwyn KatosDavid B Darlena Koval 11/15/2016, 3:10 AM

## 2016-11-15 NOTE — Progress Notes (Signed)
Pt previously on versed drip; 20 ml of 50 ml bag wasted in sink; witnessed by Tory EmeraldMichelle Toler, RN.  Burnard BuntingKatrice Khamila Bassinger, RN

## 2016-11-16 ENCOUNTER — Inpatient Hospital Stay (HOSPITAL_COMMUNITY): Payer: Medicare Other

## 2016-11-16 DIAGNOSIS — Z4659 Encounter for fitting and adjustment of other gastrointestinal appliance and device: Secondary | ICD-10-CM

## 2016-11-16 LAB — POCT I-STAT 3, ART BLOOD GAS (G3+)
Acid-Base Excess: 4 mmol/L — ABNORMAL HIGH (ref 0.0–2.0)
Acid-Base Excess: 3 mmol/L — ABNORMAL HIGH (ref 0.0–2.0)
BICARBONATE: 31.6 mmol/L — AB (ref 20.0–28.0)
BICARBONATE: 29.7 mmol/L — AB (ref 20.0–28.0)
O2 Saturation: 94 %
O2 Saturation: 90 %
PO2 ART: 81 mmHg — AB (ref 83.0–108.0)
PO2 ART: 69 mmHg — AB (ref 83.0–108.0)
Patient temperature: 97.7
TCO2: 34 mmol/L (ref 0–100)
TCO2: 32 mmol/L (ref 0–100)
pCO2 arterial: 65.8 mmHg (ref 32.0–48.0)
pCO2 arterial: 62.4 mmHg — ABNORMAL HIGH (ref 32.0–48.0)
pH, Arterial: 7.287 — ABNORMAL LOW (ref 7.350–7.450)
pH, Arterial: 7.287 — ABNORMAL LOW (ref 7.350–7.450)

## 2016-11-16 LAB — RENAL FUNCTION PANEL
ANION GAP: 4 — AB (ref 5–15)
Albumin: 1.9 g/dL — ABNORMAL LOW (ref 3.5–5.0)
BUN: 45 mg/dL — ABNORMAL HIGH (ref 6–20)
CALCIUM: 7.9 mg/dL — AB (ref 8.9–10.3)
CHLORIDE: 108 mmol/L (ref 101–111)
CO2: 28 mmol/L (ref 22–32)
CREATININE: 1.44 mg/dL — AB (ref 0.61–1.24)
GFR calc non Af Amer: 54 mL/min — ABNORMAL LOW (ref >60)
GLUCOSE: 183 mg/dL — AB (ref 65–99)
Phosphorus: 3.5 mg/dL (ref 2.5–4.6)
Potassium: 4.6 mmol/L (ref 3.5–5.1)
SODIUM: 140 mmol/L (ref 135–145)

## 2016-11-16 LAB — COMPREHENSIVE METABOLIC PANEL
ALT: 53 U/L (ref 17–63)
ANION GAP: 9 (ref 5–15)
AST: 23 U/L (ref 15–41)
Albumin: 2 g/dL — ABNORMAL LOW (ref 3.5–5.0)
Alkaline Phosphatase: 40 U/L (ref 38–126)
BUN: 44 mg/dL — ABNORMAL HIGH (ref 6–20)
CHLORIDE: 101 mmol/L (ref 101–111)
CO2: 27 mmol/L (ref 22–32)
CREATININE: 1.48 mg/dL — AB (ref 0.61–1.24)
Calcium: 7.9 mg/dL — ABNORMAL LOW (ref 8.9–10.3)
GFR, EST NON AFRICAN AMERICAN: 52 mL/min — AB (ref >60)
Glucose, Bld: 185 mg/dL — ABNORMAL HIGH (ref 65–99)
POTASSIUM: 4.5 mmol/L (ref 3.5–5.1)
SODIUM: 137 mmol/L (ref 135–145)
Total Bilirubin: 0.5 mg/dL (ref 0.3–1.2)
Total Protein: 4.3 g/dL — ABNORMAL LOW (ref 6.5–8.1)

## 2016-11-16 LAB — PHOSPHORUS: PHOSPHORUS: 3 mg/dL (ref 2.5–4.6)

## 2016-11-16 LAB — BLOOD GAS, ARTERIAL
ACID-BASE EXCESS: 3 mmol/L — AB (ref 0.0–2.0)
Acid-Base Excess: 0.5 mmol/L (ref 0.0–2.0)
BICARBONATE: 28.6 mmol/L — AB (ref 20.0–28.0)
BICARBONATE: 26.9 mmol/L (ref 20.0–28.0)
Drawn by: 270271
Drawn by: 313941
FIO2: 60
FIO2: 40
LHR: 22 {breaths}/min
O2 SAT: 99 %
O2 Saturation: 96.3 %
PATIENT TEMPERATURE: 98.6
PCO2 ART: 57.2 mmHg — AB (ref 32.0–48.0)
PEEP/CPAP: 8 cmH2O
PEEP: 8 cmH2O
PH ART: 7.319 — AB (ref 7.350–7.450)
PO2 ART: 151 mmHg — AB (ref 83.0–108.0)
Patient temperature: 98.4
RATE: 22 resp/min
VT: 550 mL
VT: 550 mL
pCO2 arterial: 62.6 mmHg — ABNORMAL HIGH (ref 32.0–48.0)
pH, Arterial: 7.256 — ABNORMAL LOW (ref 7.350–7.450)
pO2, Arterial: 88.5 mmHg (ref 83.0–108.0)

## 2016-11-16 LAB — CULTURE, BLOOD (ROUTINE X 2)
Culture: NO GROWTH
Culture: NO GROWTH

## 2016-11-16 LAB — GLUCOSE, CAPILLARY
GLUCOSE-CAPILLARY: 172 mg/dL — AB (ref 65–99)
GLUCOSE-CAPILLARY: 158 mg/dL — AB (ref 65–99)
GLUCOSE-CAPILLARY: 196 mg/dL — AB (ref 65–99)
GLUCOSE-CAPILLARY: 179 mg/dL — AB (ref 65–99)
Glucose-Capillary: 226 mg/dL — ABNORMAL HIGH (ref 65–99)
Glucose-Capillary: 148 mg/dL — ABNORMAL HIGH (ref 65–99)

## 2016-11-16 LAB — CBC
HCT: 19.7 % — ABNORMAL LOW (ref 39.0–52.0)
HEMOGLOBIN: 6.2 g/dL — AB (ref 13.0–17.0)
MCH: 27.8 pg (ref 26.0–34.0)
MCHC: 31.5 g/dL (ref 30.0–36.0)
MCV: 88.3 fL (ref 78.0–100.0)
Platelets: 94 10*3/uL — ABNORMAL LOW (ref 150–400)
RBC: 2.23 MIL/uL — ABNORMAL LOW (ref 4.22–5.81)
RDW: 17.4 % — AB (ref 11.5–15.5)
WBC: 24.4 10*3/uL — ABNORMAL HIGH (ref 4.0–10.5)

## 2016-11-16 LAB — APTT: aPTT: 27 seconds (ref 24–36)

## 2016-11-16 LAB — MAGNESIUM: MAGNESIUM: 2.3 mg/dL (ref 1.7–2.4)

## 2016-11-16 LAB — PREPARE RBC (CROSSMATCH)

## 2016-11-16 MED ORDER — DEXTROSE 5 % IV SOLN
2.0000 g | Freq: Two times a day (BID) | INTRAVENOUS | Status: DC
  Administered 2016-11-16 – 2016-11-21 (×11): 2 g via INTRAVENOUS
  Filled 2016-11-16 (×12): qty 2

## 2016-11-16 MED ORDER — INSULIN ASPART 100 UNIT/ML ~~LOC~~ SOLN
0.0000 [IU] | SUBCUTANEOUS | Status: DC
  Administered 2016-11-16: 4 [IU] via SUBCUTANEOUS
  Administered 2016-11-16: 3 [IU] via SUBCUTANEOUS
  Administered 2016-11-16 – 2016-11-17 (×2): 4 [IU] via SUBCUTANEOUS
  Administered 2016-11-17: 3 [IU] via SUBCUTANEOUS
  Administered 2016-11-17: 4 [IU] via SUBCUTANEOUS
  Administered 2016-11-17 – 2016-11-18 (×7): 3 [IU] via SUBCUTANEOUS
  Administered 2016-11-18 – 2016-11-19 (×3): 4 [IU] via SUBCUTANEOUS
  Administered 2016-11-19: 7 [IU] via SUBCUTANEOUS
  Administered 2016-11-19: 4 [IU] via SUBCUTANEOUS
  Administered 2016-11-19: 3 [IU] via SUBCUTANEOUS
  Administered 2016-11-19 – 2016-11-20 (×3): 4 [IU] via SUBCUTANEOUS
  Administered 2016-11-20 (×2): 3 [IU] via SUBCUTANEOUS
  Administered 2016-11-20: 4 [IU] via SUBCUTANEOUS
  Administered 2016-11-20: 3 [IU] via SUBCUTANEOUS
  Administered 2016-11-21 (×2): 4 [IU] via SUBCUTANEOUS
  Administered 2016-11-21: 3 [IU] via SUBCUTANEOUS
  Administered 2016-11-21: 4 [IU] via SUBCUTANEOUS

## 2016-11-16 MED ORDER — VANCOMYCIN HCL 10 G IV SOLR
2500.0000 mg | Freq: Once | INTRAVENOUS | Status: AC
  Administered 2016-11-16: 2500 mg via INTRAVENOUS
  Filled 2016-11-16: qty 2500

## 2016-11-16 MED ORDER — FENTANYL BOLUS VIA INFUSION
50.0000 ug | INTRAVENOUS | Status: DC | PRN
  Administered 2016-11-20 – 2016-11-21 (×7): 100 ug via INTRAVENOUS
  Filled 2016-11-16: qty 100

## 2016-11-16 MED ORDER — VANCOMYCIN HCL 10 G IV SOLR
1500.0000 mg | INTRAVENOUS | Status: DC
  Administered 2016-11-17 – 2016-11-18 (×2): 1500 mg via INTRAVENOUS
  Filled 2016-11-16 (×2): qty 1500

## 2016-11-16 MED ORDER — ALTEPLASE 2 MG IJ SOLR
2.0000 mg | Freq: Once | INTRAMUSCULAR | Status: AC
  Administered 2016-11-16: 2 mg
  Filled 2016-11-16: qty 2

## 2016-11-16 MED ORDER — LACTULOSE 10 GM/15ML PO SOLN
30.0000 g | Freq: Three times a day (TID) | ORAL | Status: DC
  Administered 2016-11-16 – 2016-11-18 (×9): 30 g via ORAL
  Filled 2016-11-16 (×10): qty 45

## 2016-11-16 MED ORDER — STERILE WATER FOR INJECTION IJ SOLN
INTRAMUSCULAR | Status: AC
  Administered 2016-11-16: 10 mL
  Filled 2016-11-16: qty 10

## 2016-11-16 MED ORDER — INSULIN ASPART 100 UNIT/ML ~~LOC~~ SOLN: 0.0000 [IU] | Freq: Three times a day (TID) | SUBCUTANEOUS | Status: DC

## 2016-11-16 MED ORDER — SODIUM CHLORIDE 0.9 % IV SOLN
Freq: Once | INTRAVENOUS | Status: AC
  Administered 2016-11-16: 11:00:00 via INTRAVENOUS

## 2016-11-16 NOTE — Progress Notes (Signed)
PULMONARY / CRITICAL CARE MEDICINE   Name: Henry Watts MRN: [id number] DOB: 04/18/1961    LOS: 8 days  HISTORY OF PRESENT ILLNESS:   56 y/o man with a PMH of COPD. [facility] with SOB thought r/t LLL PNA, treated with bipap.  Improved initially but developed worsening resp status as well as AKI and hyperkalemia and was tx to [facility] 2/2.    SUBJECTIVE:  Levophed started overnight for persistent hypotension despite fluid bolus.   VITAL SIGNS: BP (!) 92/40   Pulse 69   Temp 99.6 F (37.6 C) (Oral)   Resp (!) 22   Ht 5\' 8"  (1.727 m)   Wt (!) 326 lb 8 oz (148.1 kg)   SpO2 100%   BMI 49.64 kg/m   HEMODYNAMICS: CVP:  [14 mmHg-15 mmHg] 15 mmHg  VENTILATOR SETTINGS: Vent Mode: PRVC FiO2 (%):  [40 %-60 %] 60 % Set Rate:  [18 bmp-22 bmp] 22 bmp Vt Set:  [550 mL] 550 mL PEEP:  [5 cmH20-8 cmH20] 8 cmH20 Plateau Pressure:  [17 cmH20-31 cmH20] 20 cmH20  INTAKE / OUTPUT: I/O last 3 completed shifts: In: 4846 [I.V.:1591.1; NG/GT:2680; IV Piggyback:574.9] Out: 5368 [Urine:740; Emesis/NG output:50; Other:4578]  PHYSICAL EXAMINATION: General:  Obese, chronically ill appearing male, NAD, sedated on vent  HEENT: MM pink/moist, ETT Neuro: sedated on vent, RASS -3, does not open eyes or respond to commands CV: RRR, no murmurs appreciated PULM: even/non-labored on vent, coarse breath sounds GI: rotund, mildly distended, hypoactive BM, non-tender Extremities: warm/dry, trace BLE edema  Skin: no rashes or lesions  LABS:  BMET  Recent Labs Lab 11/15/16 0227 11/15/16 1601 11/16/16 0604  NA 139 140 137  K 3.5 4.2 4.5  CL 103 103 101  CO2 30 29 27   BUN 27* 35* 44*  CREATININE 0.81 1.17 1.48*  GLUCOSE 182* 143* 185*    Electrolytes  Recent Labs Lab 11/14/16 1630 11/15/16 0227 11/15/16 1601 11/16/16 0604  CALCIUM  --  7.3* 7.7* 7.9*  MG 2.2 2.2  --  2.3  PHOS 3.5 2.9 3.0  2.9 3.0    CBC  Recent Labs Lab 11/15/16 0227 11/15/16 0812  11/16/16 0604  WBC 18.5* 21.0* 24.4*  HGB 6.5* 7.1* 6.2*  HCT 19.6* 21.7* 19.7*  PLT 75* 75* 94*    Coags  Recent Labs Lab 11/13/16 0447 11/13/16 1415 11/13/16 1522 11/14/16 0345 11/15/16 0227 11/16/16 0604  APTT >200*  --  26 27 27 27   INR 1.59 1.31 1.34  --   --   --     Sepsis Markers  Recent Labs Lab 11/10/16 0735 11/10/16 2308  11/13/16 0842 11/14/16 0345 11/15/16 0227  LATICACIDVEN 2.3* 1.8  --  4.1*  --   --   PROCALCITON 1.32  --   < > 0.89 1.41 0.35  < > = values in this interval not displayed.  ABG  Recent Labs Lab 11/14/16 0956 11/15/16 0155 11/16/16 0444  PHART 7.477* 7.331* 7.319*  PCO2ART 41.0 59.7* 57.2*  PO2ART 140.0* 81.3* 151*    Liver Enzymes  Recent Labs Lab 11/14/16 0345  11/15/16 0227 11/15/16 1601 11/16/16 0604  AST 78*  --  28  --  23  ALT 98*  --  70*  --  53  ALKPHOS 25*  --  35*  --  40  BILITOT 0.4  --  0.2*  --  0.5  ALBUMIN 1.6*  < > 1.7* 1.8* 2.0*  < > = values in  this interval not displayed.  Cardiac Enzymes No results for input(s): TROPONINI, PROBNP in the last 168 hours.  Glucose  Recent Labs Lab 11/15/16 0743 11/15/16 1140 11/15/16 1520 11/15/16 1949 11/15/16 2335 11/16/16 0330  GLUCAP 173* 150* 160* 129* 179* 226*    Imaging No results found. STUDIES:  Echo 2/5 - EF 55-60%, G1DD  CULTURES: MRSA 2/2 > (-) Blood culture and flu (-) at [place] RSV 2/4 >> POS Flu A  Blood 2/4 >> NGx4d Trach 2/4 >> respiratory flora only - FINAL.  ANTIBIOTICS: Vanc and Levaquin at [facility] 2/4 >>>2/7 Meropenem 2/4 >>>2/8 Levofloxacin 2/8>>> Tamiflu 2/4>>> 2/8  SIGNIFICANT EVENTS: 1/31 admit at [place] for SOB. Rx as PNA 2/2 transferred to [facility] for AKI, resp fx 2/6 shock, some bleeding from rt fem a line (hematoma), 3 units prbc, ffp, refractory shock resolved by angiotensin infusion  LINES/TUBES: RIJ Temp HD Cath 2/4> LIJ CVC 2/5> R femoral art line 2/4>2/6 L fem aline 2/6>>> ETT  2/4>  DISCUSSION: 56 y/o man with COPD exacerbation +/- pneumonia, developing renal and resp failure. Transferred to [facility] for further management. Intubated. On CVVH.    ASSESSMENT / PLAN:  PULMONARY A: Acute on Chronic Hypoxemic Hypercapneic resp failure - multifactorial r/t Flu, AECOPD, PNA +/- untreated OSA  Hx OSA - noncompliant with CPAP  P:   Vent support   Pulmicort BID, Duoneb q6 Neg balance  ABG reviewed, keep same MV, goal is to SBT ABg reviewed, re reduce peep , fio2   CARDIAC A:  Sinus tachycardia Volume overload Shock - presume septic  Echo 2/6 - EF 65-70%, G1DD  A.fib with RVR - resolved anemia P : Fluid removal as tolerated via CVVHD per renal Continue stress-dose steroids -- on chronic pred for COPD  S/p angiotensin II (LJPC-501) infusion, could restart if needed On levophed gtt, low dose 1 unit prbc  RENAL A:   AKI  Hyperkalemia - resolved.   P:   CVVHD per renal  AM BMP  INFECTIOUS A:   Concern for RLL PNA Flu A POS Procalcitonin WNL Leukocytosis - worsening.  P:   likely fevers, wbc up, dclevaquin Add ceftaz, vanc  GI:  Constipation CT abd 2/6 with no signs of retro bleed. P:  TF Pepcid  LFT wnl Add lactulose   HEME:  Leukocytosis - worsening.  Anemia - severe. Hematoma from R femoral line possibly contributing. Hgb 6.2 this AM.  Drop plat (conusmption, diltution) P:  AM CBC after 1 unit SCDs Fluid removal should increase HCT  ENDOCRINE A: DM AI P: SSI  Stress steroids   NEUROLOGIC  A: Chronic Pain.  Anxiety Sedation needs on vent  P: RASS goal: -1 Continue fentanyl gtt Versed 2mg  q10min PRN  Tarri Abernethy, MD, MPH PGY-2 [facility]  STAFF NOTE: I, Rory Percy, MD FACP have personally reviewed patient's available data, including medical history, events of note, physical examination and test results as part of my evaluation. I have discussed with resident/NP and other care providers  such as pharmacist, RN and RRT. In addition, I personally evaluated patient and elicited key findings of: not awake, shivering, coarse BS, ij some slight oozing, rt leg hematoma continues to improve daily, back om low dose levo, give one unit prbc, pcxr repeat now, ABg reviewed, pulse ox accuracy a concern, with paO2 on abg, drop fio2 then repeat abg, likely can drop peep to 5, then SBT, if dychrony again noted on WUA , then attempt PCV, keep  plat less 30 , will need to consider drop removal to 50 per hour if levo remains, will discuss goals, with slight temp on cvvh broaden coverage, repeat [facility] X[facility] The patient is critically ill with multiple organ systems failure and requires high complexity decision making for assessment and support, frequent evaluation and titration of therapies, application of advanced monitoring technologies and extensive interpretation of multiple databases.   Critical Care Time devoted to patient care services described in this note is 30 Minutes. This time reflects time of care of this signee: Rory Percy, MD FACP. This critical care time does not reflect procedure time, or teaching time or supervisory time of PA/NP/Med student/Med Resident etc but could involve care discussion time. Rest per NP/medical resident whose note is outlined above and that I agree with   Mcarthur Rossetti. Tyson Alias, MD, FACP Pgr: [phone] [facility] 11/16/2016 9:53 AM

## 2016-11-16 NOTE — Procedures (Signed)
ELECTROENCEPHALOGRAM REPORT  Date of Study: 11/16/2016  Patient's Name: Henry Watts MRN: [id number] Date of Birth: 05/02/1961  Referring Provider: Dr. Merrie Roof  Clinical History: This is a 56 year old man with altered mental status, tremors  Medications: fentaNYL (SUBLIMAZE) 2,500 mcg in sodium chloride 0.9 % 250 mL (10 mcg/mL) infusion  acetaminophen (TYLENOL) tablet 650 mg  albuterol (PROVENTIL) (2.5 MG/3ML) 0.083% nebulizer solution 2.5 mg  amiodarone (NEXTERONE PREMIX) 360-4.14 MG/200ML-% (1.8 mg/mL) IV infusion  budesonide (PULMICORT) nebulizer solution 0.5 mg  chlorhexidine gluconate (MEDLINE KIT) (PERIDEX) 0.12 % solution 15 mL  Chlorhexidine Gluconate Cloth 2 % PADS 6 each  famotidine (PEPCID) IVPB 20 mg premix  feeding supplement (PRO-STAT SUGAR FREE 64) liquid 30 mL  feeding supplement (VITAL HIGH PROTEIN) liquid 1,000 mL  hydrocortisone sodium succinate (SOLU-CORTEF) 100 MG injection 100 mg  ipratropium-albuterol (DUONEB) 0.5-2.5 (3) MG/3ML nebulizer solution 3 mL  levofloxacin (LEVAQUIN) IVPB 750 mg  norepinephrine (LEVOPHED) 16 mg in dextrose 5 % 250 mL (0.064 mg/mL) infusion  ondansetron (ZOFRAN) injection 4 mg  oseltamivir (TAMIFLU) 6 MG/ML suspension 75 mg  sennosides (SENOKOT) 8.8 MG/5ML syrup 5 mL  vecuronium (NORCURON) injection 10 mg   Technical Summary: A multichannel digital EEG recording measured by the international 10-20 system with electrodes applied with paste and impedances below 5000 ohms performed as portable with EKG monitoring in an intubated and sedated patient.  Hyperventilation and photic stimulation were not performed.  The digital EEG was referentially recorded, reformatted, and digitally filtered in a variety of bipolar and referential montages for optimal display.   Description: The patient is intubated and sedated on Fentanyl during the recording. There is no clear posterior dominant rhythm. The background consists of a large  amount of diffuse 4-5 Hz theta and 2-3 Hz delta slowing, at times with triphasic-like appearance. Normal sleep architecture is not seen. With noxious stimulation, there is an increase in faster frequencies and muscle artifact seen. Hyperventilation and photic stimulation were not performed. He is noted to have a constant tremor not clearly visualized on video but seen on EKG lead, no epileptiform correlate seen. There were no epileptiform discharges or electrographic seizures seen.    EKG lead was unremarkable.  Impression: This sedated EEG is abnormal due to moderate diffuse slowing of the background.  Clinical Correlation of the above findings indicates diffuse cerebral dysfunction that is non-specific in etiology and can be seen with hypoxic/ischemic injury, toxic/metabolic encephalopathies, or medication effect from Fentanyl. There were no electrographic seizures in this study. Tremor did not show any epileptiform correlate. Clinical correlation is advised.   Ellouise Newer, M.D.

## 2016-11-16 NOTE — Progress Notes (Signed)
  Pharmacy Antibiotic Note  Henry Watts is a 56 y.o. male admitted on 11/27/2016 with sepsis secondary to influenza and pneumonia, on CRRT.  Pharmacy has been consulted for vancomycin and ceftazidime dosing. He has had a Tmax of 99.6 and increasing WBC count to 24.4. WIll discontinue levofloxacin and start vancomycin/ceftazidime. Continue tamiflu. Plan: - Vancomycin 2500 mg IV x1 loading dose - Vancomycin 1500 mg IV q24h - Ceftazidime 2g IV q12h - Tamiflu 75 mg BID - Monitor renal plans and length of therapy  Height: 5\' 8"  (172.7 cm) Weight: (!) 326 lb 8 oz (148.1 kg) IBW/kg (Calculated) : 68.4  Temp (24hrs), Avg:98.7 F (37.1 C), Min:97.7 F (36.5 C), Max:99.6 F (37.6 C)   Recent Labs Lab 11/10/16 0735  11/10/16 2308  11/13/16 0842  11/14/16 0345 11/14/16 0730 11/14/16 1628 11/14/16 1630 11/15/16 0227 11/15/16 0812 11/15/16 1601 11/16/16 0604  WBC  --   --   --   < >  --   < > 17.5* 16.7*  --  16.4* 18.5* 21.0*  --  24.4*  CREATININE 4.11*  < >  --   < >  --   < > 1.69*  --  1.13  --  0.81  --  1.17 1.48*  LATICACIDVEN 2.3*  --  1.8  --  4.1*  --   --   --   --   --   --   --   --   --   VANCORANDOM  --   --   --   --   --   --   --  11  --   --   --   --   --   --   < > = values in this interval not displayed.  Estimated Creatinine Clearance: 80.9 mL/min (by C-G formula based on SCr of 1.48 mg/dL (H)).    Allergies  Allergen Reactions  . Penicillins Hives    Has patient had a PCN reaction causing immediate rash, facial/tongue/throat swelling, SOB or lightheadedness with hypotension: Yes Has patient had a PCN reaction causing severe rash involving mucus membranes or skin necrosis: No Has patient had a PCN reaction that required hospitalization unknown Has patient had a PCN reaction occurring within the last 10 years: No If all of the above answers are "NO", then may proceed with Cephalosporin use.    Antimicrobials this admission: Vanc/LVQ @ [facility] * Re[facility]  [facility], (SCr 1.1 1/30, 2.9 2/1), but none since Vanc 2/4 >>2/6   2/9 >> Merrem 2/4 >>2/8 Tamiflu 2/4>> Levofloxacin 2/8 >> 2/9 Ceftazidime 2/9>>  Dose adjustments: VR: 11 (vanc d/c'd after level obtained)  Cultures 1/30 [facility] Blood >> negative  2/2 MRSA PCR >> neg 2/4 Influenza - positive 2/4 BCx: ngtd 2/4 Trach asp: norm flora  Thank you for allowing pharmacy to be a part of this patient's care.  Casilda Carlsaylor Carleena Mires, PharmD, BCPS PGY-2 Infectious Diseases Pharmacy Resident Pager: [phone] 11/16/2016 10:28 AM

## 2016-11-16 NOTE — Progress Notes (Signed)
Assessment/Plan: 1 AKI oliguric ATN, fluid removal as tolerated via CRRT, no heparin--- 2 COPD per CCM 3 VDRF  4 obesity 5 ABLA, recurrent-per CCM 6 Poss pneumonia 7 Shock improved, back on pressors  Subjective: Interval History:Hgb and  BP dropped again  Objective: Vital signs in last 24 hours: Temp:  [97.5 F (36.4 C)-99.6 F (37.6 C)] 97.7 F (36.5 C) (02/09 1110) Pulse Rate:  [61-101] 84 (02/09 1244) Resp:  [8-28] 28 (02/09 1244) BP: (89-117)/(40-56) 110/46 (02/09 0744) SpO2:  [87 %-100 %] 100 % (02/09 1244) Arterial Line BP: (86-131)/(41-56) 109/50 (02/09 0945) FiO2 (%):  [40 %-60 %] 40 % (02/09 1244) Weight:  [148.1 kg (326 lb 8 oz)] 148.1 kg (326 lb 8 oz) (02/09 0500) Weight change: 1.3 kg (2 lb 13.9 oz)  Intake/Output from previous day: 02/08 0701 - 02/09 0700 In: 3166.7 [I.V.:851.8; NG/GT:1840; IV Piggyback:474.9] Out: 2564 [Urine:225] Intake/Output this shift: Total I/O In: 230.6 [I.V.:100.6; Blood:30; NG/GT:100] Out: 895 [Urine:125; Other:770]  General appearance: intubated GI: distended  Anasarca'  Lab Results:  Recent Labs  11/15/16 0812 11/16/16 0604  WBC 21.0* 24.4*  HGB 7.1* 6.2*  HCT 21.7* 19.7*  PLT 75* 94*   BMET:  Recent Labs  11/15/16 1601 11/16/16 0604  NA 140 137  K 4.2 4.5  CL 103 101  CO2 29 27  GLUCOSE 143* 185*  BUN 35* 44*  CREATININE 1.17 1.48*  CALCIUM 7.7* 7.9*   No results for input(s): PTH in the last 72 hours. Iron Studies: No results for input(s): IRON, TIBC, TRANSFERRIN, FERRITIN in the last 72 hours. Studies/Results: Dg Chest Port 1 View  Result Date: 11/16/2016 CLINICAL DATA:  56 year old male intubated, possible pulmonary edema. Initial encounter. EXAM: PORTABLE CHEST 1 VIEW COMPARISON:  11/15/2016 and earlier. FINDINGS: Stable endotracheal tube tip. Stable bilateral IJ approach vascular catheters. Enteric tube courses to the abdomen, tip not included. No pneumothorax. Pulmonary vascularity appears stable  since 11/12/2016 without strong evidence of acute pulmonary edema. Veiling bibasilar pulmonary opacity and confluent retrocardiac opacity. Stable cardiac size and mediastinal contours. IMPRESSION: 1.  Stable lines and tubes. 2. Small bilateral pleural effusions and bilateral lower lobe collapse or consolidation. 3. No acute pulmonary edema suspected. Electronically Signed   By: Genevie Ann M.D.   On: 11/16/2016 10:53   Dg Chest Port 1 View  Result Date: 11/15/2016 CLINICAL DATA:  Pulmonary edema EXAM: PORTABLE CHEST 1 VIEW COMPARISON:  11/14/2016 FINDINGS: Cardiac shadow is stable. Endotracheal tube, nasogastric catheter and bilateral jugular central lines are again seen and stable. Lungs are well aerated bilaterally. Bibasilar opacities are again identified. Generalized increased density is noted over the left chest likely related to a posterior fusion. Small right effusion is noted. IMPRESSION: Stable bibasilar densities. Tubes and lines as described above. Changes consistent with bilateral pleural effusions. Electronically Signed   By: Inez Catalina M.D.   On: 11/15/2016 07:32    Scheduled: . budesonide (PULMICORT) nebulizer solution  0.5 mg Nebulization BID  . cefTAZidime (FORTAZ)  IV  2 g Intravenous Q12H  . chlorhexidine gluconate (MEDLINE KIT)  15 mL Mouth Rinse BID  . Chlorhexidine Gluconate Cloth  6 each Topical Daily  . famotidine (PEPCID) IV  20 mg Intravenous Daily  . feeding supplement (PRO-STAT SUGAR FREE 64)  30 mL Per Tube BID  . feeding supplement (VITAL HIGH PROTEIN)  1,000 mL Per Tube Q24H  . hydrocortisone sod succinate (SOLU-CORTEF) inj  100 mg Intravenous Q8H  . insulin aspart  0-20 Units Subcutaneous  Q4H  . ipratropium-albuterol  3 mL Nebulization Q6H  . lactulose  30 g Oral TID  . levofloxacin (LEVAQUIN) IV  750 mg Intravenous Q24H  . mouth rinse  15 mL Mouth Rinse QID  . oseltamivir  75 mg Oral BID  . polyethylene glycol  17 g Oral Daily  . sodium chloride flush  10-40 mL  Intracatheter Q12H  . [START ON 11/17/2016] vancomycin  1,500 mg Intravenous Q24H    LOS: 7 days   Arend Bahl C 11/16/2016,1:42 PM

## 2016-11-16 NOTE — Progress Notes (Signed)
EEG completed; results pending.    

## 2016-11-16 NOTE — Progress Notes (Signed)
CSW engaged with Patient's wife via T/C regarding housing concerns. Patient's wife reports that the home they are presently renting is being put up for sale and she is in need of housing assistance. CSW provided Patient's wife with housing resources and encouraged to her contact the Homelessness Prevention Counselor's through [employer] [place]. Patient's wife agreeable. CSW also provided resources to RN for Patient's wife upon arrival to hospital. Patient's wife appreciative of information provided. CSW signing off. Please contact should new need(s) arise.    Enos FlingAshley Lindie Roberson, MSW, LCSW [facility] ED/[facility] Clinical Social Worker [phone]

## 2016-11-17 ENCOUNTER — Inpatient Hospital Stay (HOSPITAL_COMMUNITY): Payer: Medicare Other

## 2016-11-17 ENCOUNTER — Encounter (HOSPITAL_COMMUNITY): Payer: Self-pay

## 2016-11-17 LAB — TYPE AND SCREEN
BLOOD PRODUCT EXPIRATION DATE: 201803012359
BLOOD PRODUCT EXPIRATION DATE: 201803052359
Blood Product Expiration Date: 201803012359
Blood Product Expiration Date: 201803022359
Blood Product Expiration Date: 201803022359
Blood Product Expiration Date: 201803052359
Blood Product Expiration Date: 201803062359
Blood Product Expiration Date: 201803082359
ISSUE DATE / TIME: 201802070045
ISSUE DATE / TIME: 201802060447
ISSUE DATE / TIME: 201802060447
ISSUE DATE / TIME: 201802060545
ISSUE DATE / TIME: 201802061112
ISSUE DATE / TIME: 201802061600
ISSUE DATE / TIME: 201802080328
ISSUE DATE / TIME: 201802091048
UNIT TYPE AND RH: 5100
UNIT TYPE AND RH: 5100
UNIT TYPE AND RH: 7300
UNIT TYPE AND RH: 7300
UNIT TYPE AND RH: 7300
UNIT TYPE AND RH: 9500
UNIT TYPE AND RH: 9500
Unit Type and Rh: 7300

## 2016-11-17 LAB — COMPREHENSIVE METABOLIC PANEL
ALK PHOS: 50 U/L (ref 38–126)
ALT: 53 U/L (ref 17–63)
AST: 29 U/L (ref 15–41)
Albumin: 2.2 g/dL — ABNORMAL LOW (ref 3.5–5.0)
Anion gap: 9 (ref 5–15)
BILIRUBIN TOTAL: 0.6 mg/dL (ref 0.3–1.2)
BUN: 44 mg/dL — AB (ref 6–20)
CALCIUM: 8.3 mg/dL — AB (ref 8.9–10.3)
CO2: 28 mmol/L (ref 22–32)
CREATININE: 1.02 mg/dL (ref 0.61–1.24)
Chloride: 101 mmol/L (ref 101–111)
GFR calc Af Amer: >60 mL/min (ref >60)
GLUCOSE: 212 mg/dL — AB (ref 65–99)
Potassium: 4.9 mmol/L (ref 3.5–5.1)
Sodium: 138 mmol/L (ref 135–145)
TOTAL PROTEIN: 4.6 g/dL — AB (ref 6.5–8.1)

## 2016-11-17 LAB — APTT: aPTT: 24 seconds (ref 24–36)

## 2016-11-17 LAB — GLUCOSE, CAPILLARY
GLUCOSE-CAPILLARY: 131 mg/dL — AB (ref 65–99)
Glucose-Capillary: 172 mg/dL — ABNORMAL HIGH (ref 65–99)
Glucose-Capillary: 130 mg/dL — ABNORMAL HIGH (ref 65–99)
Glucose-Capillary: 136 mg/dL — ABNORMAL HIGH (ref 65–99)
Glucose-Capillary: 147 mg/dL — ABNORMAL HIGH (ref 65–99)

## 2016-11-17 LAB — RENAL FUNCTION PANEL
ALBUMIN: 2 g/dL — AB (ref 3.5–5.0)
Anion gap: 6 (ref 5–15)
BUN: 48 mg/dL — ABNORMAL HIGH (ref 6–20)
CHLORIDE: 105 mmol/L (ref 101–111)
CO2: 25 mmol/L (ref 22–32)
CREATININE: 1.04 mg/dL (ref 0.61–1.24)
Calcium: 8 mg/dL — ABNORMAL LOW (ref 8.9–10.3)
GFR calc Af Amer: >60 mL/min (ref >60)
GLUCOSE: 142 mg/dL — AB (ref 65–99)
POTASSIUM: 4.7 mmol/L (ref 3.5–5.1)
Phosphorus: 3 mg/dL (ref 2.5–4.6)
Sodium: 136 mmol/L (ref 135–145)

## 2016-11-17 LAB — BLOOD GAS, ARTERIAL
Acid-Base Excess: 2.7 mmol/L — ABNORMAL HIGH (ref 0.0–2.0)
Bicarbonate: 28.6 mmol/L — ABNORMAL HIGH (ref 20.0–28.0)
DRAWN BY: 24485
FIO2: 40
O2 Saturation: 96.8 %
PEEP: 8 cmH2O
Patient temperature: 99.6
RATE: 22 resp/min
VT: 550 mL
pCO2 arterial: 62.2 mmHg — ABNORMAL HIGH (ref 32.0–48.0)
pH, Arterial: 7.288 — ABNORMAL LOW (ref 7.350–7.450)
pO2, Arterial: 95.8 mmHg (ref 83.0–108.0)

## 2016-11-17 LAB — CBC
HCT: 23 % — ABNORMAL LOW (ref 39.0–52.0)
HEMOGLOBIN: 7.1 g/dL — AB (ref 13.0–17.0)
MCH: 28 pg (ref 26.0–34.0)
MCHC: 30.9 g/dL (ref 30.0–36.0)
MCV: 90.6 fL (ref 78.0–100.0)
PLATELETS: 103 10*3/uL — AB (ref 150–400)
RBC: 2.54 MIL/uL — AB (ref 4.22–5.81)
RDW: 17 % — ABNORMAL HIGH (ref 11.5–15.5)
WBC: 19.1 10*3/uL — AB (ref 4.0–10.5)

## 2016-11-17 LAB — TRIGLYCERIDES: Triglycerides: 310 mg/dL — ABNORMAL HIGH (ref <150)

## 2016-11-17 LAB — MAGNESIUM: Magnesium: 2.5 mg/dL — ABNORMAL HIGH (ref 1.7–2.4)

## 2016-11-17 LAB — LACTIC ACID, PLASMA: Lactic Acid, Venous: 1.4 mmol/L (ref 0.5–1.9)

## 2016-11-17 LAB — PROTIME-INR
INR: 1.09
PROTHROMBIN TIME: 14.1 s (ref 11.4–15.2)

## 2016-11-17 LAB — PHOSPHORUS: Phosphorus: 2.9 mg/dL (ref 2.5–4.6)

## 2016-11-17 MED ORDER — SODIUM CHLORIDE 0.9 % IV SOLN
25.0000 ug/h | INTRAVENOUS | Status: DC
  Administered 2016-11-17: 400 ug/h via INTRAVENOUS
  Administered 2016-11-17: 350 ug/h via INTRAVENOUS
  Administered 2016-11-18 (×3): 300 ug/h via INTRAVENOUS
  Administered 2016-11-19 (×2): 350 ug/h via INTRAVENOUS
  Administered 2016-11-20: 400 ug/h via INTRAVENOUS
  Administered 2016-11-20: 300 ug/h via INTRAVENOUS
  Administered 2016-11-20: 350 ug/h via INTRAVENOUS
  Administered 2016-11-21 (×2): 300 ug/h via INTRAVENOUS
  Filled 2016-11-17 (×14): qty 50

## 2016-11-17 MED ORDER — MIDAZOLAM HCL 2 MG/2ML IJ SOLN
2.0000 mg | INTRAMUSCULAR | Status: AC | PRN
  Administered 2016-11-17 – 2016-11-18 (×3): 2 mg via INTRAVENOUS
  Filled 2016-11-17 (×3): qty 2

## 2016-11-17 MED ORDER — PROPOFOL 1000 MG/100ML IV EMUL
INTRAVENOUS | Status: AC
  Administered 2016-11-17: 25 ug/kg/min
  Filled 2016-11-17: qty 100

## 2016-11-17 MED ORDER — FENTANYL CITRATE (PF) 100 MCG/2ML IJ SOLN: 50.0000 ug | Freq: Once | INTRAMUSCULAR | Status: DC

## 2016-11-17 MED ORDER — PROPOFOL 1000 MG/100ML IV EMUL
0.0000 ug/kg/min | INTRAVENOUS | Status: DC
  Administered 2016-11-17: 45 ug/kg/min via INTRAVENOUS
  Administered 2016-11-17: 30 ug/kg/min via INTRAVENOUS
  Administered 2016-11-18 – 2016-11-20 (×22): 50 ug/kg/min via INTRAVENOUS
  Filled 2016-11-17 (×4): qty 100
  Filled 2016-11-17: qty 200
  Filled 2016-11-17 (×4): qty 100
  Filled 2016-11-17: qty 200
  Filled 2016-11-17 (×6): qty 100
  Filled 2016-11-17: qty 200
  Filled 2016-11-17 (×4): qty 100
  Filled 2016-11-17: qty 200
  Filled 2016-11-17: qty 100
  Filled 2016-11-17: qty 200

## 2016-11-17 NOTE — Progress Notes (Addendum)
PULMONARY / CRITICAL CARE MEDICINE   Name: Henry Watts MRN: [id number] DOB: Apr 22, 1961    LOS: 8 days  HISTORY OF PRESENT ILLNESS:   56 y/o man with a PMH of COPD. Initially admitted to [facility] 1/31 with SOB thought r/t LLL PNA, treated with bipap.  Improved initially but developed worsening resp status as well as AKI and hyperkalemia and was tx to [facility] 2/2.    SUBJECTIVE:  Off pressors, agitated  VITAL SIGNS: BP (!) 133/59   Pulse 86   Temp 98.3 F (36.8 C) (Oral)   Resp (!) 26   Ht 5\' 8"  (1.727 m)   Wt (!) 320 lb 1.7 oz (145.2 kg)   SpO2 96%   BMI 48.67 kg/m   HEMODYNAMICS:    VENTILATOR SETTINGS: Vent Mode: PRVC FiO2 (%):  [40 %] 40 % Set Rate:  [22 bmp] 22 bmp Vt Set:  [550 mL-571 mL] 550 mL PEEP:  [8 cmH20] 8 cmH20 Plateau Pressure:  [19 cmH20-34 cmH20] 34 cmH20  INTAKE / OUTPUT: I/O last 3 completed shifts: In: 5736.9 [I.V.:1691.9; Blood:335; NG/GT:2910; IV Piggyback:800] Out: 4098 [JXBJY:7829; Other:6002]  PHYSICAL EXAMINATION: General:  Obese, chronically ill appearing male, NAD, sedated on vent  HEENT: MM pink/moist, ETT Neuro: sedated on vent, RASS -3, does not open eyes or respond to commands, requires NMB CV: RRR, no murmurs appreciated PULM: even/non-labored on vent, coarse breath sounds GI: rotund, mildly distended, hypoactive BM, non-tender Extremities: warm/dry, trace BLE edema  Skin: no rashes or lesions. Scrotal edema with ecchymosis   LABS:  BMET  Recent Labs Lab 11/16/16 0604 11/16/16 1431 11/17/16 0432  NA 137 140 138  K 4.5 4.6 4.9  CL 101 108 101  CO2 27 28 28   BUN 44* 45* 44*  CREATININE 1.48* 1.44* 1.02  GLUCOSE 185* 183* 212*    Electrolytes  Recent Labs Lab 11/15/16 0227  11/16/16 0604 11/16/16 1431 11/17/16 0432  CALCIUM 7.3*  < > 7.9* 7.9* 8.3*  MG 2.2  --  2.3  --  2.5*  PHOS 2.9  < > 3.0 3.5 2.9  < > = values in this interval not displayed.  CBC  Recent Labs Lab 11/15/16 0812 11/16/16 0604  11/17/16 0432  WBC 21.0* 24.4* 19.1*  HGB 7.1* 6.2* 7.1*  HCT 21.7* 19.7* 23.0*  PLT 75* 94* 103*    Coags  Recent Labs Lab 11/13/16 1415 11/13/16 1522  11/15/16 0227 11/16/16 0604 11/17/16 0432  APTT  --  26  < > 27 27 24   INR 1.31 1.34  --   --   --  1.09  < > = values in this interval not displayed.  Sepsis Markers  Recent Labs Lab 11/10/16 2308  11/13/16 0842 11/14/16 0345 11/15/16 0227  LATICACIDVEN 1.8  --  4.1*  --   --   PROCALCITON  --   < > 0.89 1.41 0.35  < > = values in this interval not displayed.  ABG  Recent Labs Lab 11/16/16 0444 11/16/16 1248 11/17/16 0410  PHART 7.319* 7.256* 7.288*  PCO2ART 57.2* 62.6* 62.2*  PO2ART 151* 88.5 95.8    Liver Enzymes  Recent Labs Lab 11/15/16 0227  11/16/16 0604 11/16/16 1431 11/17/16 0432  AST 28  --  23  --  29  ALT 70*  --  53  --  53  ALKPHOS 35*  --  40  --  50  BILITOT 0.2*  --  0.5  --  0.6  ALBUMIN 1.7*  < >  2.0* 1.9* 2.2*  < > = values in this interval not displayed.  Cardiac Enzymes No results for input(s): TROPONINI, PROBNP in the last 168 hours.  Glucose  Recent Labs Lab 11/16/16 1254 11/16/16 1507 11/16/16 2008 11/16/16 2315 11/17/16 0323 11/17/16 0741  GLUCAP 158* 196* 148* 179* 172* 130*    Imaging Dg Chest Port 1 View  Result Date: 11/17/2016 CLINICAL DATA:  PNEUMONIA EXAM: PORTABLE CHEST 1 VIEW COMPARISON:  11/17/2016 FINDINGS: Support devices are stable. Small right pleural effusion. Bibasilar atelectasis or infiltrates with improvement in aeration in the lung bases since prior study. IMPRESSION: Bibasilar atelectasis or infiltrates, improved since prior study. Small right effusion. Electronically Signed   By: Charlett Nose M.D.   On: 11/17/2016 07:09   Dg Chest Port 1 View  Result Date: 11/16/2016 CLINICAL DATA:  56 year old male intubated, possible pulmonary edema. Initial encounter. EXAM: PORTABLE CHEST 1 VIEW COMPARISON:  11/15/2016 and earlier. FINDINGS: Stable  endotracheal tube tip. Stable bilateral IJ approach vascular catheters. Enteric tube courses to the abdomen, tip not included. No pneumothorax. Pulmonary vascularity appears stable since 11/12/2016 without strong evidence of acute pulmonary edema. Veiling bibasilar pulmonary opacity and confluent retrocardiac opacity. Stable cardiac size and mediastinal contours. IMPRESSION: 1.  Stable lines and tubes. 2. Small bilateral pleural effusions and bilateral lower lobe collapse or consolidation. 3. No acute pulmonary edema suspected. Electronically Signed   By: Odessa Fleming M.D.   On: 11/16/2016 10:53   STUDIES:  Echo 2/5 - EF 55-60%, G1DD  CULTURES: MRSA 2/2 > (-) Blood culture and flu (-) at [place] RSV 2/4 >> POS Flu A  Blood 2/4 >> NGx4d Trach 2/4 >> respiratory flora only - FINAL.  ANTIBIOTICS: Vanc and Levaquin at [facility] 2/4 >>>2/7 Meropenem 2/4 >>>2/8 Levofloxacin 2/8>>> Tamiflu 2/4>>>  Ceftaz 2/9>>  SIGNIFICANT EVENTS: 1/31 admit at [place] for SOB. Rx as PNA 2/2 transferred to [facility] for AKI, resp fx 2/6 shock, some bleeding from rt fem a line (hematoma), 3 units prbc, ffp, refractory shock resolved by angiotensin infusion  LINES/TUBES: RIJ Temp HD Cath 2/4> LIJ CVC 2/5> R femoral art line 2/4>2/6 L fem aline 2/6>>> ETT 2/4>  DISCUSSION: 56 y/o man with COPD exacerbation +/- pneumonia, developing renal and resp failure. Transferred to [facility] for further management. Intubated. On CVVH.    ASSESSMENT / PLAN:  PULMONARY A: Acute on Chronic Hypoxemic Hypercapneic resp failure - multifactorial r/t Flu, AECOPD, PNA +/- untreated OSA  Hx OSA - noncompliant with CPAP  P:   Vent support   Pulmicort BID, Duoneb q6 Neg balance  Use diprivan to decrease auto peep and asynchrony with vent. May need PCV. Suspect once volume removed he can be extubated  CARDIAC A:  Sinus tachycardia Volume overload Shock - presume septic , resolved 2/10 Echo 2/6 - EF 65-70%, G1DD  A.fib with RVR  - resolved anemia P : Fluid removal as tolerated via CVVHD per renal Continue stress-dose steroids -- on chronic pred for COPD  S/p angiotensin II (LJPC-501) infusion, could restart if needed Off levophed gtt, 1 unit prbc 2/9  RENAL Lab Results  Component Value Date   CREATININE 1.02 11/17/2016   CREATININE 1.44 (H) 11/16/2016   CREATININE 1.48 (H) 11/16/2016    Recent Labs Lab 11/16/16 0604 11/16/16 1431 11/17/16 0432  K 4.5 4.6 4.9     A:   AKI  Hyperkalemia - resolved.   P:   CVVHD per renal  BMP  INFECTIOUS A:   Concern for  RLL PNA Flu A POS Procalcitonin WNL Leukocytosis - better.  P:   likely fevers, wbc up, dclevaquin  ceftaz, vanc,levaquin,tamiflu  GI:  Constipation CT abd 2/6 with no signs of retro bleed. P:  TF Pepcid  LFT wnl  lactulose   HEME:   Recent Labs  11/16/16 0604 11/17/16 0432  HGB 6.2* 7.1*    Leukocytosis - better  Anemia - severe. Hematoma from R femoral line possibly contributing. Hgb 7.1 Drop plat (conusmption, diltution)improved 2/10 94->103 P:  Follow CBC  SCDs Fluid removal should increase HCT  ENDOCRINE CBG (last 3)   Recent Labs  11/16/16 2315 11/17/16 0323 11/17/16 0741  GLUCAP 179* 172* 130*     A: DM AI P: SSI  Stress steroids   NEUROLOGIC  A: Chronic Pain.  Anxiety Sedation needs on vent  P: RASS goal: -1 Continue fentanyl gtt Versed 2mg  q3915min PRN 2/10 will change to diprivan for sedation now that he is off pressors. Air traps on vent unless sedated and intermittent nmb  App cct 30 min  Brett CanalesSteve Minor ACNP [facility] Pager [phone] till 3 pm If no answer page [phone] 11/17/2016, 8:56 AM  Attending Note:  56 year old male with renal and respiratory failure who is extremely agitated and very desynchronous with the ventilator, combative at time after a CAP and COPD exacerbation episode.  Patient is very agitated on exam with coarse BS diffusely.  I reviewed CXR myself, ETT in  good position.  Will change him to PCV with low rate and high volume (that was the only way we managed to get patient to be synchronous with the ventilator).  Continue CVVH with volume removal.  Discussed with RN and RT, if he needs propofol to control agitation to place [facility].  No extubation today.  Stool OB given low Hg and dark stool.  If positive will need GI to see.  The patient is critically ill with multiple organ systems failure and requires high complexity decision making for assessment and support, frequent evaluation and titration of therapies, application of advanced monitoring technologies and extensive interpretation of multiple databases.   Critical Care Time devoted to patient care services described in this note is  35  Minutes. This time reflects time of care of this signee Dr Koren BoundWesam Yacoub. This critical care time does not reflect procedure time, or teaching time or supervisory time of PA/NP/Med student/Med Resident etc but could involve care discussion time.  Alyson ReedyWesam G. Yacoub, M.D. [facility] Pulmonary/Critical Care Medicine. Pager: [phone]. After hours pager: [phone].

## 2016-11-17 NOTE — Progress Notes (Signed)
Assessment/Plan: 1 AKI oliguric ATN, fluid removal as tolerated via CRRT, no heparin--- -150cc/hr 2 COPD per CCM 3 VDRF  4 obesity 5 ABLA, recurrent-per CCM 6 Poss pneumonia 7 Shock improved, offpressors  Subjective: Interval History: Got PRBCs  Objective: Vital signs in last 24 hours: Temp:  [97.5 F (36.4 C)-98.4 F (36.9 C)] 98.3 F (36.8 C) (02/10 0737) Pulse Rate:  [69-96] 77 (02/10 0500) Resp:  [7-31] 13 (02/10 0700) BP: (133)/(59) 133/59 (02/09 1613) SpO2:  [92 %-100 %] 97 % (02/10 0700) Arterial Line BP: (104-153)/(42-67) 129/59 (02/10 0700) FiO2 (%):  [40 %] 40 % (02/10 0700) Weight:  [145.2 kg (320 lb 1.7 oz)] 145.2 kg (320 lb 1.7 oz) (02/10 0422) Weight change: -2.9 kg (-6 lb 6.3 oz)  Intake/Output from previous day: 02/09 0701 - 02/10 0700 In: 4260.7 [I.V.:1055.7; Blood:335; NG/GT:2070; IV Piggyback:800] Out: 5225 [Urine:975] Intake/Output this shift: No intake/output data recorded.  General appearance: sedated, paralyzed GI: protuberant Extremities: edema 2+  Lab Results:  Recent Labs  11/16/16 0604 11/17/16 0432  WBC 24.4* 19.1*  HGB 6.2* 7.1*  HCT 19.7* 23.0*  PLT 94* 103*   BMET:  Recent Labs  11/16/16 1431 11/17/16 0432  NA 140 138  K 4.6 4.9  CL 108 101  CO2 28 28  GLUCOSE 183* 212*  BUN 45* 44*  CREATININE 1.44* 1.02  CALCIUM 7.9* 8.3*   No results for input(s): PTH in the last 72 hours. Iron Studies: No results for input(s): IRON, TIBC, TRANSFERRIN, FERRITIN in the last 72 hours. Studies/Results: Dg Chest Port 1 View  Result Date: 11/17/2016 CLINICAL DATA:  PNEUMONIA EXAM: PORTABLE CHEST 1 VIEW COMPARISON:  11/17/2016 FINDINGS: Support devices are stable. Small right pleural effusion. Bibasilar atelectasis or infiltrates with improvement in aeration in the lung bases since prior study. IMPRESSION: Bibasilar atelectasis or infiltrates, improved since prior study. Small right effusion. Electronically Signed   By: Rolm Baptise M.D.    On: 11/17/2016 07:09   Dg Chest Port 1 View  Result Date: 11/16/2016 CLINICAL DATA:  56 year old male intubated, possible pulmonary edema. Initial encounter. EXAM: PORTABLE CHEST 1 VIEW COMPARISON:  11/15/2016 and earlier. FINDINGS: Stable endotracheal tube tip. Stable bilateral IJ approach vascular catheters. Enteric tube courses to the abdomen, tip not included. No pneumothorax. Pulmonary vascularity appears stable since 11/12/2016 without strong evidence of acute pulmonary edema. Veiling bibasilar pulmonary opacity and confluent retrocardiac opacity. Stable cardiac size and mediastinal contours. IMPRESSION: 1.  Stable lines and tubes. 2. Small bilateral pleural effusions and bilateral lower lobe collapse or consolidation. 3. No acute pulmonary edema suspected. Electronically Signed   By: Genevie Ann M.D.   On: 11/16/2016 10:53    Scheduled: . budesonide (PULMICORT) nebulizer solution  0.5 mg Nebulization BID  . cefTAZidime (FORTAZ)  IV  2 g Intravenous Q12H  . chlorhexidine gluconate (MEDLINE KIT)  15 mL Mouth Rinse BID  . Chlorhexidine Gluconate Cloth  6 each Topical Daily  . famotidine (PEPCID) IV  20 mg Intravenous Daily  . feeding supplement (PRO-STAT SUGAR FREE 64)  30 mL Per Tube BID  . feeding supplement (VITAL HIGH PROTEIN)  1,000 mL Per Tube Q24H  . hydrocortisone sod succinate (SOLU-CORTEF) inj  100 mg Intravenous Q8H  . insulin aspart  0-20 Units Subcutaneous Q4H  . ipratropium-albuterol  3 mL Nebulization Q6H  . lactulose  30 g Oral TID  . levofloxacin (LEVAQUIN) IV  750 mg Intravenous Q24H  . mouth rinse  15 mL Mouth Rinse QID  .  oseltamivir  75 mg Oral BID  . polyethylene glycol  17 g Oral Daily  . sodium chloride flush  10-40 mL Intracatheter Q12H  . vancomycin  1,500 mg Intravenous Q24H   Continuous: . fentaNYL infusion INTRAVENOUS 400 mcg/hr (11/17/16 0510)  . Study Medication - Expanded Acess for LJPC-501 Stopped (11/15/16 0800)  . norepinephrine (LEVOPHED) Adult  infusion Stopped (11/17/16 0600)  . dialysis replacement fluid (prismasate) 300 mL/hr at 11/16/16 0907  . dialysis replacement fluid (prismasate) 300 mL/hr at 11/17/16 0211  . dialysate (PRISMASATE) 1,500 mL/hr at 11/17/16 0404    LOS: 8 days   Zakaria Sedor C 11/17/2016,8:22 AM

## 2016-11-18 ENCOUNTER — Inpatient Hospital Stay (HOSPITAL_COMMUNITY): Payer: Medicare Other

## 2016-11-18 LAB — COMPREHENSIVE METABOLIC PANEL
ALBUMIN: 2.4 g/dL — AB (ref 3.5–5.0)
ALK PHOS: 54 U/L (ref 38–126)
ALT: 56 U/L (ref 17–63)
AST: 32 U/L (ref 15–41)
Anion gap: 11 (ref 5–15)
BUN: 51 mg/dL — ABNORMAL HIGH (ref 6–20)
CALCIUM: 8.7 mg/dL — AB (ref 8.9–10.3)
CHLORIDE: 100 mmol/L — AB (ref 101–111)
CO2: 26 mmol/L (ref 22–32)
CREATININE: 1.17 mg/dL (ref 0.61–1.24)
GFR calc non Af Amer: >60 mL/min (ref >60)
GLUCOSE: 163 mg/dL — AB (ref 65–99)
Potassium: 4.9 mmol/L (ref 3.5–5.1)
SODIUM: 137 mmol/L (ref 135–145)
Total Bilirubin: 0.6 mg/dL (ref 0.3–1.2)
Total Protein: 5 g/dL — ABNORMAL LOW (ref 6.5–8.1)

## 2016-11-18 LAB — MAGNESIUM: Magnesium: 2.4 mg/dL (ref 1.7–2.4)

## 2016-11-18 LAB — BLOOD GAS, ARTERIAL
Acid-base deficit: 11.2 mmol/L — ABNORMAL HIGH (ref 0.0–2.0)
BICARBONATE: 14 mmol/L — AB (ref 20.0–28.0)
DRAWN BY: 244851
FIO2: 40
LHR: 22 {breaths}/min
MECHVT: 550 mL
O2 Saturation: 93.6 %
PCO2 ART: 29.1 mmHg — AB (ref 32.0–48.0)
PEEP/CPAP: 5 cmH2O
PO2 ART: 68.5 mmHg — AB (ref 83.0–108.0)
Patient temperature: 98
pH, Arterial: 7.302 — ABNORMAL LOW (ref 7.350–7.450)

## 2016-11-18 LAB — RENAL FUNCTION PANEL
ALBUMIN: 2.4 g/dL — AB (ref 3.5–5.0)
Albumin: 2.3 g/dL — ABNORMAL LOW (ref 3.5–5.0)
Anion gap: 12 (ref 5–15)
Anion gap: 11 (ref 5–15)
BUN: 51 mg/dL — AB (ref 6–20)
BUN: 49 mg/dL — ABNORMAL HIGH (ref 6–20)
CALCIUM: 8.7 mg/dL — AB (ref 8.9–10.3)
CHLORIDE: 101 mmol/L (ref 101–111)
CO2: 25 mmol/L (ref 22–32)
CO2: 24 mmol/L (ref 22–32)
CREATININE: 1.16 mg/dL (ref 0.61–1.24)
CREATININE: 1.19 mg/dL (ref 0.61–1.24)
Calcium: 8.5 mg/dL — ABNORMAL LOW (ref 8.9–10.3)
Chloride: 100 mmol/L — ABNORMAL LOW (ref 101–111)
GFR calc Af Amer: >60 mL/min (ref >60)
GFR calc non Af Amer: >60 mL/min (ref >60)
GLUCOSE: 163 mg/dL — AB (ref 65–99)
Glucose, Bld: 136 mg/dL — ABNORMAL HIGH (ref 65–99)
PHOSPHORUS: 3.1 mg/dL (ref 2.5–4.6)
Phosphorus: 3.3 mg/dL (ref 2.5–4.6)
Potassium: 4.9 mmol/L (ref 3.5–5.1)
Potassium: 4.8 mmol/L (ref 3.5–5.1)
SODIUM: 137 mmol/L (ref 135–145)
Sodium: 136 mmol/L (ref 135–145)

## 2016-11-18 LAB — CBC
HCT: 22.9 % — ABNORMAL LOW (ref 39.0–52.0)
HEMOGLOBIN: 7 g/dL — AB (ref 13.0–17.0)
MCH: 28.1 pg (ref 26.0–34.0)
MCHC: 30.6 g/dL (ref 30.0–36.0)
MCV: 92 fL (ref 78.0–100.0)
Platelets: 114 10*3/uL — ABNORMAL LOW (ref 150–400)
RBC: 2.49 MIL/uL — ABNORMAL LOW (ref 4.22–5.81)
RDW: 17.5 % — AB (ref 11.5–15.5)
WBC: 21.5 10*3/uL — AB (ref 4.0–10.5)

## 2016-11-18 LAB — GLUCOSE, CAPILLARY
GLUCOSE-CAPILLARY: 133 mg/dL — AB (ref 65–99)
GLUCOSE-CAPILLARY: 138 mg/dL — AB (ref 65–99)
GLUCOSE-CAPILLARY: 141 mg/dL — AB (ref 65–99)
GLUCOSE-CAPILLARY: 145 mg/dL — AB (ref 65–99)
GLUCOSE-CAPILLARY: 199 mg/dL — AB (ref 65–99)
Glucose-Capillary: 164 mg/dL — ABNORMAL HIGH (ref 65–99)
Glucose-Capillary: 172 mg/dL — ABNORMAL HIGH (ref 65–99)

## 2016-11-18 LAB — OCCULT BLOOD X 1 CARD TO LAB, STOOL: Fecal Occult Bld: POSITIVE — AB

## 2016-11-18 LAB — PREPARE RBC (CROSSMATCH)

## 2016-11-18 LAB — APTT: APTT: 25 s (ref 24–36)

## 2016-11-18 MED ORDER — SODIUM CHLORIDE 0.9 % IV SOLN: Freq: Once | INTRAVENOUS | Status: DC

## 2016-11-18 NOTE — Progress Notes (Signed)
PULMONARY / CRITICAL CARE MEDICINE   Name: Henry Watts R Farinas MRN: [id number] DOB: 01/14/1961    LOS: 8 days  HISTORY OF PRESENT ILLNESS:   56 y/o man with a PMH of COPD. [facility] with SOB thought r/t LLL PNA, treated with bipap.  Improved initially but developed worsening resp status as well as AKI and hyperkalemia and was tx to [facility] 2/2.    SUBJECTIVE:  Requires heavy sedation for vent asynchrony.  VITAL SIGNS: BP (!) 85/45   Pulse (!) 102   Temp 99.4 F (37.4 C) (Oral)   Resp (!) 30   Ht 5\' 8"  (1.727 m)   Wt (!) 316 lb 5.8 oz (143.5 kg)   SpO2 100%   BMI 48.10 kg/m   HEMODYNAMICS: CVP:  [18 mmHg] 18 mmHg  VENTILATOR SETTINGS: Vent Mode: PRVC FiO2 (%):  [40 %] 40 % Set Rate:  [6 bmp-22 bmp] 22 bmp Vt Set:  [600 mL] 600 mL PEEP:  [5 cmH20] 5 cmH20 Plateau Pressure:  [20 cmH20-23 cmH20] 20 cmH20  INTAKE / OUTPUT: I/O last 3 completed shifts: In: 6089 [I.V.:2229; Other:100; NG/GT:2910; IV Piggyback:850] Out: 9321 [Urine:975; Other:8346]  PHYSICAL EXAMINATION: General:  Obese, chronically ill appearing male, NAD, sedated on vent but air stacking and holding breath HEENT: MM pink/moist, ETT Neuro: sedated on vent, RASS -3, does not open eyes or respond to commands, requires NMB CV: RRR, no murmurs appreciated PULM: even/non-labored on vent, coarse breath sounds, air stacks when not sedated GI: rotund, mildly distended, hypoactive BM, non-tender Extremities: warm/dry, trace BLE edema  Skin: no rashes or lesions. Scrotal edema with ecchymosis   LABS:  BMET  Recent Labs Lab 11/17/16 0432 11/17/16 1600 11/18/16 0415  NA 138 136 137  137  K 4.9 4.7 4.9  4.9  CL 101 105 100*  100*  CO2 28 25 26  25   BUN 44* 48* 51*  51*  CREATININE 1.02 1.04 1.17  1.16  GLUCOSE 212* 142* 163*  163*    Electrolytes  Recent Labs Lab 11/16/16 0604  11/17/16 0432 11/17/16 1600 11/18/16 0415  CALCIUM 7.9*  < > 8.3* 8.0* 8.7*  8.7*  MG 2.3   --  2.5*  --  2.4  PHOS 3.0  < > 2.9 3.0 3.1  < > = values in this interval not displayed.  CBC  Recent Labs Lab 11/16/16 0604 11/17/16 0432 11/18/16 0415  WBC 24.4* 19.1* 21.5*  HGB 6.2* 7.1* 7.0*  HCT 19.7* 23.0* 22.9*  PLT 94* 103* 114*    Coags  Recent Labs Lab 11/13/16 1415 11/13/16 1522  11/16/16 0604 11/17/16 0432 11/18/16 0415  APTT  --  26  < > 27 24 25   INR 1.31 1.34  --   --  1.09  --   < > = values in this interval not displayed.  Sepsis Markers  Recent Labs Lab 11/13/16 0842 11/14/16 0345 11/15/16 0227 11/17/16 1313  LATICACIDVEN 4.1*  --   --  1.4  PROCALCITON 0.89 1.41 0.35  --     ABG  Recent Labs Lab 11/16/16 1248 11/17/16 0410 11/18/16 0400  PHART 7.256* 7.288* 7.302*  PCO2ART 62.6* 62.2* 29.1*  PO2ART 88.5 95.8 68.5*    Liver Enzymes  Recent Labs Lab 11/16/16 0604  11/17/16 0432 11/17/16 1600 11/18/16 0415  AST 23  --  29  --  32  ALT 53  --  53  --  56  ALKPHOS 40  --  50  --  54  BILITOT 0.5  --  0.6  --  0.6  ALBUMIN 2.0*  < > 2.2* 2.0* 2.4*  2.4*  < > = values in this interval not displayed.  Cardiac Enzymes No results for input(s): TROPONINI, PROBNP in the last 168 hours.  Glucose  Recent Labs Lab 11/17/16 1147 11/17/16 1600 11/17/16 1950 11/18/16 0031 11/18/16 0339 11/18/16 0834  GLUCAP 136* 131* 147* 164* 145* 141*    Imaging Dg Chest Port 1 View  Result Date: 11/18/2016 CLINICAL DATA:  Respiratory failure EXAM: PORTABLE CHEST 1 VIEW COMPARISON:  11/17/2016 FINDINGS: Support devices are stable. Bibasilar opacities, improved on the left, slightly increased on the right. Heart is borderline in size. No effusions. IMPRESSION: Bibasilar atelectasis or infiltrates, improving on the left, slightly increased on the right. Electronically Signed   By: Charlett Nose M.D.   On: 11/18/2016 07:31   STUDIES:  Echo 2/5 - EF 55-60%, G1DD  CULTURES: MRSA 2/2 > (-) Blood culture and flu (-) at [place] RSV 2/4 >>  POS Flu A  Blood 2/4 >> NGx4d Trach 2/4 >> respiratory flora only - FINAL.  ANTIBIOTICS: Vanc and Levaquin at [facility] 2/4 >>>2/7 Meropenem 2/4 >>>2/8 Levofloxacin 2/8>>> Tamiflu 2/4>>>  Ceftaz 2/9>>  SIGNIFICANT EVENTS: 1/31 admit at [place] for SOB. Rx as PNA 2/2 transferred to [facility] for AKI, resp fx 2/6 shock, some bleeding from rt fem a line (hematoma), 3 units prbc, ffp, refractory shock resolved by angiotensin infusion  LINES/TUBES: RIJ Temp HD Cath 2/4> LIJ CVC 2/5> R femoral art line 2/4>2/6 L fem aline 2/6>>> ETT 2/4>  DISCUSSION: 56 y/o man with COPD exacerbation +/- pneumonia, developing renal and resp failure. Transferred to [facility] for further management. Intubated. On CVVH.    ASSESSMENT / PLAN:  PULMONARY A: Acute on Chronic Hypoxemic Hypercapneic resp failure - multifactorial r/t Flu, AECOPD, PNA +/- untreated OSA  Hx OSA - noncompliant with CPAP  P:   Vent support   Pulmicort BID, Duoneb q6 Neg balance  Use diprivan to decrease auto peep and asynchrony with vent. May need PCV.  Failed PCV needs heavy sedation  CARDIAC A:  Sinus tachycardia Volume overload Shock - presume septic , resolved 2/10 Echo 2/6 - EF 65-70%, G1DD  A.fib with RVR - resolved anemia P : Fluid removal as tolerated via CVVHD per renal Continue stress-dose steroids -- on chronic pred for COPD  S/p angiotensin II (LJPC-501) infusion, could restart if needed On and Off levophed gtt,while on CVVH 1 unit prbc 2/9  RENAL Lab Results  Component Value Date   CREATININE 1.17 11/18/2016   CREATININE 1.16 11/18/2016   CREATININE 1.04 11/17/2016    Recent Labs Lab 11/17/16 0432 11/17/16 1600 11/18/16 0415  K 4.9 4.7 4.9  4.9     A:   AKI  Hyperkalemia - resolved.   P:   CVVHD per renal  BMP  INFECTIOUS A:   Concern for RLL PNA Flu A POS Procalcitonin WNL Leukocytosis - better.  P:   likely fevers, wbc up, dclevaquin  ceftaz, vanc,levaquin,tamiflu  GI:   Constipation CT abd 2/6 with no signs of retro bleed. P:  TF Pepcid  LFT wnl  lactulose   HEME:   Recent Labs  11/17/16 0432 11/18/16 0415  HGB 7.1* 7.0*    Leukocytosis - better  Anemia - severe. Hematoma from R femoral line possibly contributing. Hgb 7.1 Drop plat (conusmption, diltution)improved 2/10 94->103 P:  Follow CBC  SCDs Fluid removal should increase HCT,  may need transfusion soon  ENDOCRINE CBG (last 3)   Recent Labs  11/18/16 0031 11/18/16 0339 11/18/16 0834  GLUCAP 164* 145* 141*     A: DM AI P: SSI  Stress steroids   NEUROLOGIC  A: Chronic Pain.  Anxiety Sedation needs on vent  P: RASS goal: -1 Continue fentanyl gtt Versed 2mg  q71min PRN 2/10 will change to diprivan for sedation now that he is off pressors. Air traps on vent unless sedated and intermittent nmb 2/11 air traps despite sedation and multiple vent modes.  App cct 30 min  Brett Canales Minor ACNP [facility] P[facility] Pager [phone] till 3 pm If no answer page [phone] 11/18/2016, 10:21 AM  Attending Note:  56 year old male with renal and respiratory failure who is very agitated and desynchronous with ventilator who is respiratory failure with CAP and AE-COPD.  On exam, agitated and with coarse BS diffusely.  Changed back to PCV with low rate and high volume.  Continue CVVH for now with volume negative.  Once dry then will likely give patient a trial extubation.  Continue steroids and hold off extubation for today.  Stool OB to be sent today, spoke with RN about that.  If positive then may need GI to see.  The patient is critically ill with multiple organ systems failure and requires high complexity decision making for assessment and support, frequent evaluation and titration of therapies, application of advanced monitoring technologies and extensive interpretation of multiple databases.   Critical Care Time devoted to patient care services described in this note is  35  Minutes. This  time reflects time of care of this signee Dr Koren Bound. This critical care time does not reflect procedure time, or teaching time or supervisory time of PA/NP/Med student/Med Resident etc but could involve care discussion time.  Alyson Reedy, M.D. [facility] Pulmonary/Critical Care Medicine. Pager: [phone]. After hours pager: [phone].

## 2016-11-18 NOTE — Progress Notes (Signed)
Assessment/Plan: 1 AKI oliguric ATN, fluid removal as tolerated via CRRT, no heparin--- -UF as tol-cc/hr 2 COPD/Flu/PNA per CCM 3 VDRF  4 obesity 5 ABLA, recurrent-per CCM 7 Shock, give blood   Subjective: Interval History: soft BPs  Objective: Vital signs in last 24 hours: Temp:  [97.6 F (36.4 C)-99.4 F (37.4 C)] 99.4 F (37.4 C) (02/11 0833) Pulse Rate:  [66-117] 92 (02/11 1204) Resp:  [9-30] 24 (02/11 1204) BP: (85-100)/(45-55) 100/53 (02/11 1204) SpO2:  [85 %-100 %] 100 % (02/11 1204) Arterial Line BP: (79-169)/(41-75) 96/50 (02/11 0800) FiO2 (%):  [40 %] 40 % (02/11 1205) Weight:  [143.5 kg (316 lb 5.8 oz)] 143.5 kg (316 lb 5.8 oz) (02/11 0345) Weight change: -1.7 kg (-3 lb 12 oz)  Intake/Output from previous day: 02/10 0701 - 02/11 0700 In: 4421.9 [I.V.:1701.9; NG/GT:1820; IV Piggyback:800] Out: 6012 [Urine:300] Intake/Output this shift: Total I/O In: 1656.9 [I.V.:486.9; NG/GT:420; IV Piggyback:750] Out: 2107 [Other:1957; Stool:150]  General appearance: some responsiveness Head: Normocephalic, without obvious abnormality, atraumatic, oral intubation Extremities: edema 2-3+ edemaprotuberant abdomen   Lab Results:  Recent Labs  11/17/16 0432 11/18/16 0415  WBC 19.1* 21.5*  HGB 7.1* 7.0*  HCT 23.0* 22.9*  PLT 103* 114*   BMET:  Recent Labs  11/17/16 1600 11/18/16 0415  NA 136 137  137  K 4.7 4.9  4.9  CL 105 100*  100*  CO2 25 26  25   GLUCOSE 142* 163*  163*  BUN 48* 51*  51*  CREATININE 1.04 1.17  1.16  CALCIUM 8.0* 8.7*  8.7*   No results for input(s): PTH in the last 72 hours. Iron Studies: No results for input(s): IRON, TIBC, TRANSFERRIN, FERRITIN in the last 72 hours. Studies/Results: Dg Chest Port 1 View  Result Date: 11/18/2016 CLINICAL DATA:  Respiratory failure EXAM: PORTABLE CHEST 1 VIEW COMPARISON:  11/17/2016 FINDINGS: Support devices are stable. Bibasilar opacities, improved on the left, slightly increased on the right.  Heart is borderline in size. No effusions. IMPRESSION: Bibasilar atelectasis or infiltrates, improving on the left, slightly increased on the right. Electronically Signed   By: Rolm Baptise M.D.   On: 11/18/2016 07:31   Dg Chest Port 1 View  Result Date: 11/17/2016 CLINICAL DATA:  PNEUMONIA EXAM: PORTABLE CHEST 1 VIEW COMPARISON:  11/17/2016 FINDINGS: Support devices are stable. Small right pleural effusion. Bibasilar atelectasis or infiltrates with improvement in aeration in the lung bases since prior study. IMPRESSION: Bibasilar atelectasis or infiltrates, improved since prior study. Small right effusion. Electronically Signed   By: Rolm Baptise M.D.   On: 11/17/2016 07:09    Scheduled: . budesonide (PULMICORT) nebulizer solution  0.5 mg Nebulization BID  . cefTAZidime (FORTAZ)  IV  2 g Intravenous Q12H  . chlorhexidine gluconate (MEDLINE KIT)  15 mL Mouth Rinse BID  . Chlorhexidine Gluconate Cloth  6 each Topical Daily  . famotidine (PEPCID) IV  20 mg Intravenous Daily  . feeding supplement (PRO-STAT SUGAR FREE 64)  30 mL Per Tube BID  . feeding supplement (VITAL HIGH PROTEIN)  1,000 mL Per Tube Q24H  . fentaNYL (SUBLIMAZE) injection  50 mcg Intravenous Once  . hydrocortisone sod succinate (SOLU-CORTEF) inj  100 mg Intravenous Q8H  . insulin aspart  0-20 Units Subcutaneous Q4H  . ipratropium-albuterol  3 mL Nebulization Q6H  . lactulose  30 g Oral TID  . mouth rinse  15 mL Mouth Rinse QID  . polyethylene glycol  17 g Oral Daily  . sodium chloride flush  10-40 mL Intracatheter Q12H   Continuous: . fentaNYL infusion INTRAVENOUS 300 mcg/hr (11/18/16 0800)  . Study Medication - Expanded Acess for LJPC-501 Stopped (11/15/16 0800)  . norepinephrine (LEVOPHED) Adult infusion 5 mcg/min (11/18/16 0800)  . dialysis replacement fluid (prismasate) 300 mL/hr at 11/18/16 1336  . dialysis replacement fluid (prismasate) 300 mL/hr at 11/17/16 0211  . dialysate (PRISMASATE) 1,500 mL/hr at 11/18/16  1336  . propofol (DIPRIVAN) infusion 50 mcg/kg/min (11/18/16 1200)      LOS: 9 days   Guynell Kleiber C 11/18/2016,2:35 PM

## 2016-11-19 ENCOUNTER — Inpatient Hospital Stay (HOSPITAL_COMMUNITY): Payer: Medicare Other

## 2016-11-19 ENCOUNTER — Encounter (HOSPITAL_COMMUNITY): Payer: Self-pay | Admitting: Physician Assistant

## 2016-11-19 DIAGNOSIS — D62 Acute posthemorrhagic anemia: Secondary | ICD-10-CM

## 2016-11-19 DIAGNOSIS — R195 Other fecal abnormalities: Secondary | ICD-10-CM

## 2016-11-19 LAB — CBC
HCT: 28.1 % — ABNORMAL LOW (ref 39.0–52.0)
HEMOGLOBIN: 8.8 g/dL — AB (ref 13.0–17.0)
MCH: 27.8 pg (ref 26.0–34.0)
MCHC: 31.3 g/dL (ref 30.0–36.0)
MCV: 88.6 fL (ref 78.0–100.0)
PLATELETS: 124 10*3/uL — AB (ref 150–400)
RBC: 3.17 MIL/uL — ABNORMAL LOW (ref 4.22–5.81)
RDW: 18 % — AB (ref 11.5–15.5)
WBC: 23 10*3/uL — ABNORMAL HIGH (ref 4.0–10.5)

## 2016-11-19 LAB — GLUCOSE, CAPILLARY
GLUCOSE-CAPILLARY: 168 mg/dL — AB (ref 65–99)
GLUCOSE-CAPILLARY: 72 mg/dL (ref 65–99)
Glucose-Capillary: 158 mg/dL — ABNORMAL HIGH (ref 65–99)
Glucose-Capillary: 123 mg/dL — ABNORMAL HIGH (ref 65–99)
Glucose-Capillary: 208 mg/dL — ABNORMAL HIGH (ref 65–99)
Glucose-Capillary: 165 mg/dL — ABNORMAL HIGH (ref 65–99)

## 2016-11-19 LAB — BASIC METABOLIC PANEL
Anion gap: 10 (ref 5–15)
BUN: 50 mg/dL — AB (ref 6–20)
CALCIUM: 8.7 mg/dL — AB (ref 8.9–10.3)
CO2: 26 mmol/L (ref 22–32)
CREATININE: 1.11 mg/dL (ref 0.61–1.24)
Chloride: 99 mmol/L — ABNORMAL LOW (ref 101–111)
Glucose, Bld: 182 mg/dL — ABNORMAL HIGH (ref 65–99)
Potassium: 5 mmol/L (ref 3.5–5.1)
SODIUM: 135 mmol/L (ref 135–145)

## 2016-11-19 LAB — TYPE AND SCREEN
ABO/RH(D): B POS
ANTIBODY SCREEN: NEGATIVE
Unit division: 0
Unit division: 0

## 2016-11-19 LAB — RENAL FUNCTION PANEL
Albumin: 2.1 g/dL — ABNORMAL LOW (ref 3.5–5.0)
Anion gap: 9 (ref 5–15)
BUN: 54 mg/dL — ABNORMAL HIGH (ref 6–20)
CHLORIDE: 101 mmol/L (ref 101–111)
CO2: 24 mmol/L (ref 22–32)
CREATININE: 1.16 mg/dL (ref 0.61–1.24)
Calcium: 8.5 mg/dL — ABNORMAL LOW (ref 8.9–10.3)
GFR calc Af Amer: >60 mL/min (ref >60)
Glucose, Bld: 221 mg/dL — ABNORMAL HIGH (ref 65–99)
POTASSIUM: 5 mmol/L (ref 3.5–5.1)
Phosphorus: 4.4 mg/dL (ref 2.5–4.6)
Sodium: 134 mmol/L — ABNORMAL LOW (ref 135–145)

## 2016-11-19 LAB — BLOOD GAS, ARTERIAL
Acid-base deficit: 0.5 mmol/L (ref 0.0–2.0)
BICARBONATE: 25.1 mmol/L (ref 20.0–28.0)
Drawn by: 10006
FIO2: 40
LHR: 22 {breaths}/min
MECHVT: 600 mL
O2 SAT: 92.8 %
PATIENT TEMPERATURE: 98.6
PCO2 ART: 52.5 mmHg — AB (ref 32.0–48.0)
PEEP/CPAP: 5 cmH2O
PH ART: 7.3 — AB (ref 7.350–7.450)
PO2 ART: 68.3 mmHg — AB (ref 83.0–108.0)

## 2016-11-19 LAB — C DIFFICILE QUICK SCREEN W PCR REFLEX
C DIFFICILE (CDIFF) INTERP: NOT DETECTED
C DIFFICLE (CDIFF) ANTIGEN: NEGATIVE
C Diff toxin: NEGATIVE

## 2016-11-19 LAB — APTT: aPTT: 26 seconds (ref 24–36)

## 2016-11-19 LAB — MAGNESIUM: Magnesium: 2.6 mg/dL — ABNORMAL HIGH (ref 1.7–2.4)

## 2016-11-19 LAB — PHOSPHORUS: PHOSPHORUS: 4.3 mg/dL (ref 2.5–4.6)

## 2016-11-19 LAB — ALBUMIN: Albumin: 2.3 g/dL — ABNORMAL LOW (ref 3.5–5.0)

## 2016-11-19 MED ORDER — PANTOPRAZOLE SODIUM 40 MG IV SOLR
40.0000 mg | Freq: Two times a day (BID) | INTRAVENOUS | Status: DC
  Administered 2016-11-19 – 2016-11-21 (×5): 40 mg via INTRAVENOUS
  Filled 2016-11-19 (×5): qty 40

## 2016-11-19 MED ORDER — PANTOPRAZOLE SODIUM 40 MG IV SOLR: 40.0000 mg | INTRAVENOUS | Status: DC

## 2016-11-19 MED ORDER — INSULIN GLARGINE 100 UNIT/ML ~~LOC~~ SOLN: 5.0000 [IU] | Freq: Every day | SUBCUTANEOUS | Status: DC

## 2016-11-19 MED ORDER — VANCOMYCIN HCL 10 G IV SOLR
1500.0000 mg | INTRAVENOUS | Status: DC
  Administered 2016-11-19 – 2016-11-21 (×3): 1500 mg via INTRAVENOUS
  Filled 2016-11-19 (×3): qty 1500

## 2016-11-19 NOTE — Consult Note (Signed)
[facility] Consult: 12:40 PM 11/19/2016  LOS: 10 days    Referring Provider: Dr Corrie Dandy  Primary Care Physician:  [name], MD Primary Gastroenterologist:  Althia Forts.      Reason for Consultation:  FOBT + stools and transfusion requiring anemia.   HPI: Henry Watts is a 56 y.o. male.  Resides in [place], [place]  PMH COPD. HTN.  OSA, does not tolerate sleep apnea. Morbid obesity. Type 2 DM, previously on insulin but on oral agents PTA.  Previous cardiac catheterizations in 2004 and 2008. Benign lung adenopathy 2005.  PTA meds included full dose aspirin.    Patient initially admitted to ICU at [facility] with LLL pneumonia. Was treated with antibiotics and Solu-Medrol for COPD flare. He weaned off BiPAP but subsequently developed worsening respiratory acidosis, septic shock, AKI/uremia, hyperkalemia, and was transferred to [facility] 11/08/2016.  He was started on CRRT.  He also ruled in for influenza A and required intubation, ventilator support and multiple pressors.  He was treated with  a critical-care study drug, and angiotensin II analog.  He developed blood loss anemia (hemoglobin 4.8 on 11/13/16) attributed to significant right groin bleed and hematoma at site of arterial catheter placement.  He is thrombocytopenic ( nadir 69 on 2/7).  PTT was elevated to > 200 from 2/4 - 2/6 PT reached maximum of 19 point 1, INR 1.5 on 2/6. He has required a total of 10 units PRBCs and 2 units of FFP.  Since his latest transfusions of 2/11 his anemia has finally stabilized. Stool is FOBT positive and the nurse describes it as having been melenic a few days ago but in the last 24 hours it is brown.  Flexi seal is in place.  Lovenox was discontinued on 2/4.  Protonix not in place. Has been receiving  famotidine IV once daily. 11/13/16 CT scan abdomen and pelvis without contrast:  Showed a large amount of fluid/edema/blood in the right upper thigh possibly from leaking arterial line or from recent procedure. No evidence for intra-abdominal intrapelvic bleeding.  Unremarkable liver appearance on this noncontrast study. Incidental, scattered, splenic, calcified granulomas He had mild elevation of AST/ALT to 78/98 but normal alkaline phosphatase and total bilirubin last week.  Patient is tolerating tube feeds. Initially he was receiving MiraLAX and lactulose because he was having no bowel movements but since he started having the dark, liquid stools, these have been discontinued    Past Medical History:  Diagnosis Date  . COPD (chronic obstructive pulmonary disease) ([place])   . Dyspnea   . Morbid obesity ([place]) 2018  . OSA (obstructive sleep apnea)    not on CPAP- can not tolerate    Past Surgical History:  Procedure Laterality Date  . CARDIAC CATHETERIZATION     2004 and 2008     Prior to Admission medications   Medication Sig Start Date End Date Taking? Authorizing Provider  acetaminophen (TYLENOL) 500 MG tablet Take 1,000 mg by mouth every 6 (six) hours as needed for headache (pain).   Yes Historical Provider, MD  albuterol (PROAIR HFA) 108 (90 BASE) MCG/ACT inhaler Inhale 2 puffs into the lungs every 4 (four) hours as needed for wheezing or shortness of breath. 2 puffs every 4 hours as needed only  if your can't catch your breath 11/10/13  Yes Tanda Rockers, MD  aspirin EC 325 MG tablet Take 325 mg by mouth at bedtime.   Yes Historical Provider, MD  budesonide (PULMICORT) 0.5 MG/2ML nebulizer solution Take 0.5 mg by nebulization 2 (two) times daily.   Yes Historical Provider, MD  fluticasone (FLONASE) 50 MCG/ACT nasal spray Place 2 sprays into both nostrils daily. 10/26/16  Yes Historical Provider, MD  gabapentin (NEURONTIN) 300 MG capsule Take 600 mg by mouth 4 (four) times daily.   Yes  Historical Provider, MD  ipratropium-albuterol (DUONEB) 0.5-2.5 (3) MG/3ML SOLN Take 3 mLs by nebulization every 4 (four) hours as needed (shortness of breath/ wheezing).    Yes Historical Provider, MD  lisinopril (PRINIVIL,ZESTRIL) 20 MG tablet Take 20 mg by mouth daily. 11/05/16  Yes Historical Provider, MD  Menthol-Camphor (TIGER BALM ARTHRITIS RUB EX) Apply 1 application topically daily as needed (pain).    Yes Historical Provider, MD  metFORMIN (GLUCOPHAGE) 500 MG tablet Take 500 mg by mouth 2 (two) times daily. 11/06/16  Yes Historical Provider, MD  oxyCODONE (ROXICODONE) 15 MG immediate release tablet Take 15 mg by mouth every 8 (eight) hours. scheduled 10/24/16  Yes Historical Provider, MD  OXYGEN Inhale 2.5-3 L into the lungs continuous.   Yes Historical Provider, MD  predniSONE (DELTASONE) 10 MG tablet Take 20 mg by mouth daily with breakfast.   Yes Historical Provider, MD  famotidine (PEPCID) 20 MG tablet TAKE 1 TABLET BY MOUTH AT BEDTIME Patient not taking: Reported on 11/10/2016 03/17/14   Tanda Rockers, MD  pantoprazole (PROTONIX) 40 MG tablet TAKE 1 TABLET BY MOUTH EVERY DAY, 30-60 MINUTES BEFORE FIRST MEAL OF DAY Patient not taking: Reported on 11/10/2016 03/17/14   Tanda Rockers, MD  Umeclidinium-Vilanterol [facility] ELLIPTA) 62.5-25 MCG/INH AEPB Inhale 2 puffs into the lungs once. Only open the device one time and then take your two separate drags to be sure you get it all Patient not taking: Reported on 11/10/2016 02/11/14   Tanda Rockers, MD    Scheduled Meds: . sodium chloride   Intravenous Once  . budesonide (PULMICORT) nebulizer solution  0.5 mg Nebulization BID  . cefTAZidime (FORTAZ)  IV  2 g Intravenous Q12H  . chlorhexidine gluconate (MEDLINE KIT)  15 mL Mouth Rinse BID  . Chlorhexidine Gluconate Cloth  6 each Topical Daily  . feeding supplement (PRO-STAT SUGAR FREE 64)  30 mL Per Tube BID  . feeding supplement (VITAL HIGH PROTEIN)  1,000 mL Per Tube Q24H  . fentaNYL (SUBLIMAZE)  injection  50 mcg Intravenous Once  . hydrocortisone sod succinate (SOLU-CORTEF) inj  100 mg Intravenous Q8H  . insulin aspart  0-20 Units Subcutaneous Q4H  . ipratropium-albuterol  3 mL Nebulization Q6H  . mouth rinse  15 mL Mouth Rinse QID  . [START ON 11/08/2016] pantoprazole (PROTONIX) IV  40 mg Intravenous Q24H  . sodium chloride flush  10-40 mL Intracatheter Q12H  . vancomycin  1,500 mg Intravenous Q24H   Infusions: . fentaNYL infusion INTRAVENOUS 350 mcg/hr (11/19/16 0920)  . Study Medication - Expanded Acess for LJPC-501 Stopped (11/15/16 0800)  . norepinephrine (LEVOPHED) Adult infusion Stopped (11/19/16 0200)  . dialysis replacement fluid (prismasate) 300 mL/hr at 11/19/16 1121  . dialysis replacement fluid (prismasate) 300  mL/hr at 11/19/16 0536  . dialysate (PRISMASATE) 1,500 mL/hr at 11/19/16 1121  . propofol (DIPRIVAN) infusion 50 mcg/kg/min (11/19/16 0920)   PRN Meds: sodium chloride, acetaminophen, albuterol, fentaNYL, fentaNYL (SUBLIMAZE) injection, heparin, heparin, ondansetron (ZOFRAN) IV, sennosides, sodium chloride, sodium chloride flush, vecuronium   Allergies as of 11/18/2016 - Review Complete 11/23/2016  Allergen Reaction Noted  . Penicillins Hives 11/10/2013    Family History  Problem Relation Age of Onset  . Emphysema Mother     smoked  . Asthma Mother   . Heart disease Father     Social History   Social History  . Marital status: Married    Spouse name: N/A  . Number of children: N/A  . Years of education: N/A   Occupational History  . Disabled    Social History Main Topics  . Smoking status: Current Every Day Smoker    Packs/day: 0.75    Years: 40.00    Types: Cigarettes  . Smokeless tobacco: Never Used  . Alcohol use No  . Drug use: No  . Sexual activity: Not on file   Other Topics Concern  . Not on file   Social History Narrative  . No narrative on file    REVIEW OF SYSTEMS: Unable to obtain review of systems as he is  intubated and sedated.   PHYSICAL EXAM: Vital signs in last 24 hours: Vitals:   11/19/16 0900 11/19/16 1126  BP:    Pulse: 98   Resp: 12   Temp:  99.1 F (37.3 C)   Wt Readings from Last 3 Encounters:  11/19/16 (!) 138.5 kg (305 lb 5.4 oz)  12/15/13 127 kg (280 lb)  11/10/13 127.5 kg (281 lb)    General: Morbidly obese, critically ill appearing WF who is pale and connected to multiple pumps and CRRT machinery Head:  Cushingoid faces. Eyes:  No scleral icterus or conjunctival pallor. Ears:  Unable to assess hearing  Nose:  No congestion or discharge Mouth:  Intubated, orogastric tube in place. Neck:  Obese/massive. Lungs:  Clear bilaterally in front. Unlabored breathing on vent. Heart: RRR. Abdomen:  Obese. Does not appear to be tender. No HSM or masses. No bruits.  Massive, extensive hematoma involving the right groin that extends well into the thigh and up into the right waist and flank..   Rectal: Liquid dark brown but not maroon-looking stool in the flexiseal bag, this does smell melenic. Musc/Skeltl: No joint erythema or gross deformities. Extremities:  1 plus edema in the feet. Overall edematous but not pitting upper extremities.  Neurologic:  Sedated on vent, did not respond to the exam. Skin:  Large right groin hematoma as above. Tattoos:  None observed.      Intake/Output from previous day: 02/11 0701 - 02/12 0700 In: 5904.3 [I.V.:1939.3; Blood:1325; NG/GT:1840; IV [id number]:902] Out: 8555 [Urine:695; Stool:150] Intake/Output this shift: Total I/O In: 1204.4 [I.V.:324.4; NG/GT:280; IV Piggyback:600] Out: 1242 [Urine:70; IOXBD:5329]  LAB RESULTS:  Recent Labs  11/17/16 0432 11/18/16 0415 11/19/16 0410  WBC 19.1* 21.5* 23.0*  HGB 7.1* 7.0* 8.8*  HCT 23.0* 22.9* 28.1*  PLT 103* 114* 124*   BMET Lab Results  Component Value Date   NA 135 11/19/2016   NA 136 11/18/2016   NA 137 11/18/2016   NA 137 11/18/2016   K 5.0 11/19/2016   K 4.8 11/18/2016    K 4.9 11/18/2016   K 4.9 11/18/2016   CL 99 (L) 11/19/2016   CL 101 11/18/2016   CL 100 (  L) 11/18/2016   CL 100 (L) 11/18/2016   CO2 26 11/19/2016   CO2 24 11/18/2016   CO2 26 11/18/2016   CO2 25 11/18/2016   GLUCOSE 182 (H) 11/19/2016   GLUCOSE 136 (H) 11/18/2016   GLUCOSE 163 (H) 11/18/2016   GLUCOSE 163 (H) 11/18/2016   BUN 50 (H) 11/19/2016   BUN 49 (H) 11/18/2016   BUN 51 (H) 11/18/2016   BUN 51 (H) 11/18/2016   CREATININE 1.11 11/19/2016   CREATININE 1.19 11/18/2016   CREATININE 1.17 11/18/2016   CREATININE 1.16 11/18/2016   CALCIUM 8.7 (L) 11/19/2016   CALCIUM 8.5 (L) 11/18/2016   CALCIUM 8.7 (L) 11/18/2016   CALCIUM 8.7 (L) 11/18/2016   LFT  Recent Labs  11/17/16 0432  11/18/16 0415 11/18/16 1600 11/19/16 0410  PROT 4.6*  --  5.0*  --   --   ALBUMIN 2.2*  < > 2.4*  2.4* 2.3* 2.3*  AST 29  --  32  --   --   ALT 53  --  56  --   --   ALKPHOS 50  --  54  --   --   BILITOT 0.6  --  0.6  --   --   < > = values in this interval not displayed. PT/INR Lab Results  Component Value Date   INR 1.09 11/17/2016   INR 1.34 11/13/2016   INR 1.31 11/13/2016   Hepatitis Panel No results for input(s): HEPBSAG, HCVAB, HEPAIGM, HEPBIGM in the last 72 hours. C-Diff No components found for: CDIFF Lipase  No results found for: LIPASE  Drugs of Abuse  No results found for: LABOPIA, COCAINSCRNUR, LABBENZ, AMPHETMU, THCU, LABBARB   RADIOLOGY STUDIES: Dg Chest Port 1 View  Result Date: 11/19/2016 CLINICAL DATA:  Respiratory failure EXAM: PORTABLE CHEST 1 VIEW COMPARISON:  11/18/2016 FINDINGS: Endotracheal tube in good position. NG tube in the stomach. Bilateral central venous catheter tips unchanged in the SVC and left innominate vein. No pneumothorax Progression of bibasilar airspace disease. No significant edema or effusion. IMPRESSION: Support lines remain in satisfactory position, unchanged Progression of bibasilar airspace disease. Electronically Signed   By: Franchot Gallo M.D.   On: 11/19/2016 06:43   Dg Chest Port 1 View  Result Date: 11/18/2016 CLINICAL DATA:  Respiratory failure EXAM: PORTABLE CHEST 1 VIEW COMPARISON:  11/17/2016 FINDINGS: Support devices are stable. Bibasilar opacities, improved on the left, slightly increased on the right. Heart is borderline in size. No effusions. IMPRESSION: Bibasilar atelectasis or infiltrates, improving on the left, slightly increased on the right. Electronically Signed   By: Rolm Baptise M.D.   On: 11/18/2016 07:31    ENDOSCOPIC STUDIES: No records of previous EGD or colonoscopy found in Epic or in care everywhere.  IMPRESSION:   *  Acute blood loss anemia with FOBT positive dark stools. Suspect the bulk of the blood loss has been from the large right groin hematoma and bleed following femoral line placement. Stool has been melenic, rule out ulcer disease, rule out AVMs.  *  Influenza A and HCAP with sepsis and multisystem organ failure including respiratory, renal.  *  Thrombocytopenia  *  Elevated PTT last week but patient was not on intravenous heparin at the time.    PLAN:     *  Initiate Protonix BID IV now, for some reason the order is for it to start tomorrow 2/13.  Stop the famotidine.  EGD set up for 2/13 at 11AM, bedside case.  Stop  tube feeds at [place]  11/19/2016, 12:40 PM Pager: [phone]

## 2016-11-19 NOTE — Procedures (Signed)
Admit: 11/12/2016 LOS: 10  8M dialysis dependent AKI VDRA 2/2 Pna / Flu /COPD  Current CRRT Prescription: Catheter: R IJ Nontunneled placed 11/08/2016 BFR: 200  Pre Blood Pump: 300 4K DFR: 1500 4K Replacement Rate: 300 4K Goal UF: -143m/hr Anticoagulation: No heparin in circuit Clotting: ~q12h    S: Currently off NE 6916mUOP yesterday -2.6L negative yesterday Transfused yesterday    O: 02/11 0701 - 02/12 0700 In: 5904.3 [I.V.:1939.3; Blood:1325; NG/GT:1840; IV Piggyback:800] Out: 8555 [Urine:695; Stool:150]  Filed Weights   11/17/16 0422 11/18/16 0345 11/19/16 0345  Weight: (!) 145.2 kg (320 lb 1.7 oz) (!) 143.5 kg (316 lb 5.8 oz) (!) 138.5 kg (305 lb 5.4 oz)     Recent Labs Lab 11/18/16 0415 11/18/16 1600 11/19/16 0410  NA 137  137 136 135  K 4.9  4.9 4.8 5.0  CL 100*  100* 101 99*  CO2 26  25 24 26   GLUCOSE 163*  163* 136* 182*  BUN 51*  51* 49* 50*  CREATININE 1.17  1.16 1.19 1.11  CALCIUM 8.7*  8.7* 8.5* 8.7*  PHOS 3.1 3.3 4.3    Recent Labs Lab 11/17/16 0432 11/18/16 0415 11/19/16 0410  WBC 19.1* 21.5* 23.0*  HGB 7.1* 7.0* 8.8*  HCT 23.0* 22.9* 28.1*  MCV 90.6 92.0 88.6  PLT 103* 114* 124*    Scheduled Meds: . sodium chloride   Intravenous Once  . budesonide (PULMICORT) nebulizer solution  0.5 mg Nebulization BID  . cefTAZidime (FORTAZ)  IV  2 g Intravenous Q12H  . chlorhexidine gluconate (MEDLINE KIT)  15 mL Mouth Rinse BID  . Chlorhexidine Gluconate Cloth  6 each Topical Daily  . famotidine (PEPCID) IV  20 mg Intravenous Daily  . feeding supplement (PRO-STAT SUGAR FREE 64)  30 mL Per Tube BID  . feeding supplement (VITAL HIGH PROTEIN)  1,000 mL Per Tube Q24H  . fentaNYL (SUBLIMAZE) injection  50 mcg Intravenous Once  . hydrocortisone sod succinate (SOLU-CORTEF) inj  100 mg Intravenous Q8H  . insulin aspart  0-20 Units Subcutaneous Q4H  . ipratropium-albuterol  3 mL Nebulization Q6H  . lactulose  30 g Oral TID  . mouth rinse  15  mL Mouth Rinse QID  . polyethylene glycol  17 g Oral Daily  . sodium chloride flush  10-40 mL Intracatheter Q12H   Continuous Infusions: . fentaNYL infusion INTRAVENOUS 350 mcg/hr (11/19/16 0700)  . Study Medication - Expanded Acess for LJPC-501 Stopped (11/15/16 0800)  . norepinephrine (LEVOPHED) Adult infusion Stopped (11/19/16 0200)  . dialysis replacement fluid (prismasate) 300 mL/hr at 11/18/16 1847  . dialysis replacement fluid (prismasate) 300 mL/hr at 11/19/16 0536  . dialysate (PRISMASATE) 1,500 mL/hr at 11/19/16 0703  . propofol (DIPRIVAN) infusion 50 mcg/kg/min (11/19/16 0700)   PRN Meds:.sodium chloride, acetaminophen, albuterol, fentaNYL, fentaNYL (SUBLIMAZE) injection, heparin, heparin, ondansetron (ZOFRAN) IV, sennosides, sodium chloride, sodium chloride flush, vecuronium  ABG    Component Value Date/Time   PHART 7.300 (L) 11/19/2016 0410   PCO2ART 52.5 (H) 11/19/2016 0410   PO2ART 68.3 (L) 11/19/2016 0410   HCO3 25.1 11/19/2016 0410   TCO2 32 11/15/2016 1543   ACIDBASEDEF 0.5 11/19/2016 0410   O2SAT 92.8 11/19/2016 0410    A/P  1. Dialysis dependent AKI presumed ATn: cont at current settings/ still above admission weight; electrolyts stable 2. COPD / VDRF / Flu A 3. Shock / Hypotension 4. Anemia: transfused 2/11; follow  RyPearson GrippeMD [facility] pgr [phone]

## 2016-11-19 NOTE — Progress Notes (Signed)
  Pharmacy Antibiotic Note  Henry Watts is a 56 y.o. male admitted on 12/02/2016 with sepsis secondary to influenza and pneumonia, on CRRT.  Pharmacy has been consulted for vancomycin and ceftazidime dosing. He has had a Tmax of 99.4 and increasing WBC count to 23. WIll re-culture and resume vancomycin. Last dose of vancomycin was 2/11 @ 1000.   Plan: - Resume vancomycin 1500 mg IV q24h - Ceftazidime 2g IV q12h - Monitor renal plans and length of therapy  Height: 5\' 8"  (172.7 cm) Weight: (!) 305 lb 5.4 oz (138.5 kg) IBW/kg (Calculated) : 68.4  Temp (24hrs), Avg:98.7 F (37.1 C), Min:98.1 F (36.7 C), Max:99.3 F (37.4 C)   Recent Labs Lab 11/13/16 0842  11/14/16 0730  11/15/16 0812  11/16/16 0604  11/17/16 0432 11/17/16 1313 11/17/16 1600 11/18/16 0415 11/18/16 1600 11/19/16 0410  WBC  --   < > 16.7*  < > 21.0*  --  24.4*  --  19.1*  --   --  21.5*  --  23.0*  CREATININE  --   < >  --   < >  --   < > 1.48*  < > 1.02  --  1.04 1.17  1.16 1.19 1.11  LATICACIDVEN 4.1*  --   --   --   --   --   --   --   --  1.4  --   --   --   --   VANCORANDOM  --   --  11  --   --   --   --   --   --   --   --   --   --   --   < > = values in this interval not displayed.  Estimated Creatinine Clearance: 103.7 mL/min (by C-G formula based on SCr of 1.11 mg/dL).    Allergies  Allergen Reactions  . Penicillins Hives    Has patient had a PCN reaction causing immediate rash, facial/tongue/throat swelling, SOB or lightheadedness with hypotension: Yes Has patient had a PCN reaction causing severe rash involving mucus membranes or skin necrosis: No Has patient had a PCN reaction that required hospitalization unknown Has patient had a PCN reaction occurring within the last 10 years: No If all of the above answers are "NO", then may proceed with Cephalosporin use.    Antimicrobials this admission: Vanc[facility]LVQ @ [facility] * [facility], (SCr 1.1 1/30, 2.9 2/1),  but none since Vanc 2/4 >>2/6   2/9 >> Merrem 2/4 >>2/8 Tamiflu 2/4>>2/10 Levofloxacin 2/8 >> 2/9 Ceftazidime 2/9>>  Dose adjustments: VR: 11 (vanc d/c'd after level obtained)  Cultures 1/30 [facility] Blood >> negative  2/2 MRSA PCR >> neg 2/4 Influenza - positive 2/4 BCx: ngtd 2/4 Trach asp: norm flora 2/9 BCx: ngtd 2/12 UCx:  2/12 Resp cx:  2/12 BCx:   Thank you for allowing pharmacy to be a part of this patient's care.  Casilda Carlsaylor Leaira Fullam, PharmD, BCPS PGY-2 Infectious Diseases Pharmacy Resident Pager: [phone] 11/19/2016 10:07 AM

## 2016-11-19 NOTE — Progress Notes (Signed)
PULMONARY / CRITICAL CARE MEDICINE   Name: Henry Watts MRN: [id number] DOB: [birth year]    LOS: 10 days  HISTORY OF PRESENT ILLNESS:   56 y/o man with a PMH of COPD. Initially admitted to [facility] 1/31 with SOB thought r/t LLL PNA, treated with bipap.  Improved initially but developed worsening resp status as well as AKI and hyperkalemia and was tx to [facility] 2/2.    SUBJECTIVE:  Patient remains sedated on vent. No acute events overnight.   VITAL SIGNS: BP (!) 112/52   Pulse 91   Temp 98.7 F (37.1 C) (Oral)   Resp 16   Ht 5\' 8"  (1.727 m)   Wt (!) 305 lb 5.4 oz (138.5 kg)   SpO2 100%   BMI 46.43 kg/m   HEMODYNAMICS:    VENTILATOR SETTINGS: Vent Mode: PRVC FiO2 (%):  [40 %] 40 % Set Rate:  [22 bmp] 22 bmp Vt Set:  [600 mL] 600 mL PEEP:  [5 cmH20] 5 cmH20 Plateau Pressure:  [16 cmH20-25 cmH20] 17 cmH20  INTAKE / OUTPUT: I/O last 3 completed shifts: In: 7909.6 [I.V.:2914.6; Blood:1325; NG/GT:2820; IV Piggyback:850] Out: 21308 [Urine:895; MVHQI:69629; Stool:150]  PHYSICAL EXAMINATION: General:  Obese, chronically ill appearing male, NAD, sedated on vent  HEENT: MM pink/moist, ETT Neuro: sedated on vent, RASS -3, does not open eyes or respond to commands CV: RRR, no murmurs appreciated PULM: even/non-labored on vent, coarse breath sounds bilaterally GI: rotund, mildly distended, +BM Extremities: warm/dry, trace BLE edema, SCDs in place  Skin: Scrotal edema with ecchymosis   LABS:  BMET  Recent Labs Lab 11/18/16 0415 11/18/16 1600 11/19/16 0410  NA 137  137 136 135  K 4.9  4.9 4.8 5.0  CL 100*  100* 101 99*  CO2 26  25 24 26   BUN 51*  51* 49* 50*  CREATININE 1.17  1.16 1.19 1.11  GLUCOSE 163*  163* 136* 182*    Electrolytes  Recent Labs Lab 11/17/16 0432  11/18/16 0415 11/18/16 1600 11/19/16 0410  CALCIUM 8.3*  < > 8.7*  8.7* 8.5* 8.7*  MG 2.5*  --  2.4  --  2.6*  PHOS 2.9  < > 3.1 3.3 4.3  < > = values in this interval not  displayed.  CBC  Recent Labs Lab 11/17/16 0432 11/18/16 0415 11/19/16 0410  WBC 19.1* 21.5* 23.0*  HGB 7.1* 7.0* 8.8*  HCT 23.0* 22.9* 28.1*  PLT 103* 114* 124*    Coags  Recent Labs Lab 11/13/16 1415 11/13/16 1522  11/17/16 0432 11/18/16 0415 11/19/16 0410  APTT  --  26  < > 24 25 26   INR 1.31 1.34  --  1.09  --   --   < > = values in this interval not displayed.  Sepsis Markers  Recent Labs Lab 11/13/16 0842 11/14/16 0345 11/15/16 0227 11/17/16 1313  LATICACIDVEN 4.1*  --   --  1.4  PROCALCITON 0.89 1.41 0.35  --     ABG  Recent Labs Lab 11/17/16 0410 11/18/16 0400 11/19/16 0410  PHART 7.288* 7.302* 7.300*  PCO2ART 62.2* 29.1* 52.5*  PO2ART 95.8 68.5* 68.3*    Liver Enzymes  Recent Labs Lab 11/16/16 0604  11/17/16 0432  11/18/16 0415 11/18/16 1600 11/19/16 0410  AST 23  --  29  --  32  --   --   ALT 53  --  53  --  56  --   --   ALKPHOS 40  --  50  --  54  --   --   BILITOT 0.5  --  0.6  --  0.6  --   --   ALBUMIN 2.0*  < > 2.2*  < > 2.4*  2.4* 2.3* 2.3*  < > = values in this interval not displayed.  Cardiac Enzymes No results for input(s): TROPONINI, PROBNP in the last 168 hours.  Glucose  Recent Labs Lab 11/18/16 1205 11/18/16 1617 11/18/16 1953 11/18/16 2345 11/19/16 0435 11/19/16 0728  GLUCAP 172* 133* 138* 199* 168* 158*    Imaging Dg Chest Port 1 View  Result Date: 11/19/2016 CLINICAL DATA:  Respiratory failure EXAM: PORTABLE CHEST 1 VIEW COMPARISON:  11/18/2016 FINDINGS: Endotracheal tube in good position. NG tube in the stomach. Bilateral central venous catheter tips unchanged in the SVC and left innominate vein. No pneumothorax Progression of bibasilar airspace disease. No significant edema or effusion. IMPRESSION: Support lines remain in satisfactory position, unchanged Progression of bibasilar airspace disease. Electronically Signed   By: Marlan Palau M.D.   On: 11/19/2016 06:43   STUDIES:  Echo 2/5 - EF 55-60%,  G1DD  CULTURES: MRSA 2/2 > (-) Blood culture and flu (-) at [facility] RSV 2/4 >> POS Flu A  Blood 2/4 >> NGx4d Trach 2/4 >> respiratory flora only - FINAL.  ANTIBIOTICS: Vanc and Levaquin at [facility] 2/4 >>>2/7 Meropenem 2/4 >>>2/8 Levofloxacin 2/8>>>2/11 Tamiflu 2/4>>> 2/10 Ceftaz 2/9>>>  SIGNIFICANT EVENTS: 1/31 admit at [facility] for SOB. Rx as PNA 2/2 transferred to [facility] for AKI, resp fx 2/6 shock, some bleeding from rt fem a line (hematoma), 3 units prbc, ffp, refractory shock resolved by angiotensin infusion  LINES/TUBES: RIJ Temp HD Cath 2/4> LIJ CVC 2/5> R femoral art line 2/4>2/6 L fem aline 2/6>>> ETT 2/4>  DISCUSSION: 56 y/o man with COPD exacerbation +/- pneumonia, developing renal and resp failure. Transferred to [facility] for further management. Intubated. On CVVH.   ASSESSMENT / PLAN:  PULMONARY A: Acute on Chronic Hypoxemic Hypercapneic resp failure - multifactorial r/t Flu, AECOPD, PNA +/- untreated OSA  Hx OSA - noncompliant with CPAP  P:   Vent support - PCV with low rate and high volume    Pulmicort BID, Duoneb q6 Neg balance    CARDIAC A:  Sinus tachycardia Volume overload Shock - presume septic , resolved 2/10 Echo 2/6 - EF 65-70%, G1DD  A.fib with RVR - resolved Anemia P : Fluid removal as tolerated via CVVHD per renal Continue stress-dose steroids -- on chronic pred for COPD  S/p angiotensin II (LJPC-501) infusion, could restart if needed Currently off pressors  RENAL Lab Results  Component Value Date   CREATININE 1.11 11/19/2016   CREATININE 1.19 11/18/2016   CREATININE 1.17 11/18/2016   CREATININE 1.16 11/18/2016    Recent Labs Lab 11/18/16 0415 11/18/16 1600 11/19/16 0410  K 4.9  4.9 4.8 5.0    A:   AKI - resolved.  Hyperkalemia - resolved.   P:   CVVHD per renal  BMP  INFECTIOUS A:   Concern for RLL PNA Flu A POS s/p Tamiflu course Procalcitonin WNL Leukocytosis - worsening.  P:   Continue  ceftazidime  GI:  Constipation CT abd 2/6 with no signs of retro bleed. FOBT positive P:  TF Pepcid  LFT wnl Lactulose, Miralax  Consider GI consult given positive FOBT  HEME:   Recent Labs  11/18/16 0415 11/19/16 0410  HGB 7.0* 8.8*   Leukocytosis - better  Anemia - severe but improving. Hematoma from R femoral  line possibly contributing. Drop plat (conusmption, diltution) - continues to improve P:  Follow CBC  SCDs Fluid removal should increase HCT  ENDOCRINE CBG (last 3)   Recent Labs  11/18/16 2345 11/19/16 0435 11/19/16 0728  GLUCAP 199* 168* 158*   A: DM AI P: SSI  Stress steroids   NEUROLOGIC  A: Chronic Pain.  Anxiety Sedation needs on vent  P: RASS goal: -1 Continue fentanyl gtt, propofol gtt Vecuronium q2 PRN   Tarri AbernethyAbigail J Lancaster, MD, MPH PGY-2 [facility]  Attending Note:  [age] year old male with anasarca and acute renal failure and respiratory failure due to COPD exacerbation and CAP.  On exam, coarse BS diffusely and diffuse edema.  CRRT with negative 150.  Febrile overnight.  I reviewed CXR myself, pulmonary edema noted.  Will add vanc IV.  Pan culture.  Continue full vent support.  Adjust vent for ABG.  Will have family get together for a family meeting regarding plan of care.  The patient is critically ill with multiple organ systems failure and requires high complexity decision making for assessment and support, frequent evaluation and titration of therapies, application of advanced monitoring technologies and extensive interpretation of multiple databases.   Critical Care Time devoted to patient care services described in this note is  35  Minutes. This time reflects time of care of this signee Dr Koren BoundWesam Kikue Gerhart. This critical care time does not reflect procedure time, or teaching time or supervisory time of PA/NP/Med student/Med Resident etc but could involve care discussion time.  Alyson ReedyWesam G. Zacchary Pompei, M.D. [facility]  Pulmonary/Critical Care Medicine. Pager: [phone]. After hours pager: [phone].

## 2016-11-20 ENCOUNTER — Encounter (HOSPITAL_COMMUNITY): Admission: AD | Disposition: E | Payer: Self-pay | Source: Other Acute Inpatient Hospital | Attending: Pulmonary Disease

## 2016-11-20 DIAGNOSIS — Z7189 Other specified counseling: Secondary | ICD-10-CM

## 2016-11-20 LAB — GLUCOSE, CAPILLARY
GLUCOSE-CAPILLARY: 130 mg/dL — AB (ref 65–99)
Glucose-Capillary: 146 mg/dL — ABNORMAL HIGH (ref 65–99)
Glucose-Capillary: 149 mg/dL — ABNORMAL HIGH (ref 65–99)
Glucose-Capillary: 185 mg/dL — ABNORMAL HIGH (ref 65–99)

## 2016-11-20 LAB — BLOOD GAS, ARTERIAL
ACID-BASE DEFICIT: 2.1 mmol/L — AB (ref 0.0–2.0)
Acid-base deficit: 2 mmol/L (ref 0.0–2.0)
BICARBONATE: 24.2 mmol/L (ref 20.0–28.0)
BICARBONATE: 23.6 mmol/L (ref 20.0–28.0)
Drawn by: 129711
Drawn by: 129711
FIO2: 40
FIO2: 60
LHR: 20 {breaths}/min
MECHVT: 550 mL
O2 SAT: 98.8 %
O2 Saturation: 91 %
PATIENT TEMPERATURE: 98.6
PCO2 ART: 51.4 mmHg — AB (ref 32.0–48.0)
PEEP/CPAP: 5 cmH2O
PRESSURE CONTROL: 10 cmH2O
Patient temperature: 98.6
RATE: 5 resp/min
pCO2 arterial: 56.5 mmHg — ABNORMAL HIGH (ref 32.0–48.0)
pH, Arterial: 7.255 — ABNORMAL LOW (ref 7.350–7.450)
pH, Arterial: 7.284 — ABNORMAL LOW (ref 7.350–7.450)
pO2, Arterial: 65.8 mmHg — ABNORMAL LOW (ref 83.0–108.0)
pO2, Arterial: 135 mmHg — ABNORMAL HIGH (ref 83.0–108.0)

## 2016-11-20 LAB — RENAL FUNCTION PANEL
ANION GAP: 10 (ref 5–15)
Albumin: 2.2 g/dL — ABNORMAL LOW (ref 3.5–5.0)
Albumin: 2.3 g/dL — ABNORMAL LOW (ref 3.5–5.0)
Anion gap: 8 (ref 5–15)
BUN: 55 mg/dL — ABNORMAL HIGH (ref 6–20)
BUN: 53 mg/dL — ABNORMAL HIGH (ref 6–20)
CALCIUM: 8.4 mg/dL — AB (ref 8.9–10.3)
CHLORIDE: 100 mmol/L — AB (ref 101–111)
CO2: 22 mmol/L (ref 22–32)
CO2: 23 mmol/L (ref 22–32)
CREATININE: 1.31 mg/dL — AB (ref 0.61–1.24)
Calcium: 8.6 mg/dL — ABNORMAL LOW (ref 8.9–10.3)
Chloride: 102 mmol/L (ref 101–111)
Creatinine, Ser: 1.42 mg/dL — ABNORMAL HIGH (ref 0.61–1.24)
GFR calc non Af Amer: 55 mL/min — ABNORMAL LOW (ref >60)
GFR calc non Af Amer: >60 mL/min (ref >60)
Glucose, Bld: 160 mg/dL — ABNORMAL HIGH (ref 65–99)
Glucose, Bld: 197 mg/dL — ABNORMAL HIGH (ref 65–99)
Phosphorus: 5 mg/dL — ABNORMAL HIGH (ref 2.5–4.6)
Phosphorus: 5.3 mg/dL — ABNORMAL HIGH (ref 2.5–4.6)
Potassium: 5.4 mmol/L — ABNORMAL HIGH (ref 3.5–5.1)
Potassium: 5.2 mmol/L — ABNORMAL HIGH (ref 3.5–5.1)
Sodium: 132 mmol/L — ABNORMAL LOW (ref 135–145)
Sodium: 133 mmol/L — ABNORMAL LOW (ref 135–145)

## 2016-11-20 LAB — URINE CULTURE: CULTURE: NO GROWTH

## 2016-11-20 LAB — CULTURE, RESPIRATORY

## 2016-11-20 LAB — CULTURE, RESPIRATORY W GRAM STAIN

## 2016-11-20 LAB — MAGNESIUM: Magnesium: 2.4 mg/dL (ref 1.7–2.4)

## 2016-11-20 LAB — TRIGLYCERIDES: TRIGLYCERIDES: 411 mg/dL — AB (ref <150)

## 2016-11-20 LAB — APTT: aPTT: 27 seconds (ref 24–36)

## 2016-11-20 SURGERY — EGD (ESOPHAGOGASTRODUODENOSCOPY)
Anesthesia: Moderate Sedation

## 2016-11-20 MED ORDER — SODIUM CHLORIDE 0.9 % IV SOLN
0.0000 mg/h | INTRAVENOUS | Status: DC
  Administered 2016-11-20: 10 mg/h via INTRAVENOUS
  Administered 2016-11-21 (×2): 2 mg/h via INTRAVENOUS
  Filled 2016-11-20 (×3): qty 10

## 2016-11-20 MED ORDER — MIDAZOLAM BOLUS VIA INFUSION
1.0000 mg | INTRAVENOUS | Status: DC | PRN
  Administered 2016-11-20 – 2016-11-21 (×3): 2 mg via INTRAVENOUS
  Filled 2016-11-20: qty 2

## 2016-11-20 MED ORDER — VITAL HIGH PROTEIN PO LIQD
1000.0000 mL | ORAL | Status: DC
  Administered 2016-11-20 – 2016-11-21 (×2): 1000 mL

## 2016-11-20 MED ORDER — MIDAZOLAM HCL 2 MG/2ML IJ SOLN: 2.0000 mg | Freq: Once | INTRAMUSCULAR | Status: AC

## 2016-11-20 MED ORDER — MIDAZOLAM HCL 2 MG/2ML IJ SOLN
INTRAMUSCULAR | Status: AC
  Administered 2016-11-20: 2 mg
  Filled 2016-11-20: qty 2

## 2016-11-20 MED ORDER — ALTEPLASE 2 MG IJ SOLR
2.0000 mg | Freq: Once | INTRAMUSCULAR | Status: AC
  Administered 2016-11-20: 2 mg
  Filled 2016-11-20: qty 2

## 2016-11-20 MED ORDER — STERILE WATER FOR INJECTION IJ SOLN
INTRAMUSCULAR | Status: AC
  Administered 2016-11-20: 10 mL
  Filled 2016-11-20: qty 10

## 2016-11-20 MED ORDER — DEXMEDETOMIDINE HCL IN NACL 400 MCG/100ML IV SOLN
0.4000 ug/kg/h | INTRAVENOUS | Status: DC
  Administered 2016-11-20: 2 ug/kg/h via INTRAVENOUS
  Administered 2016-11-20: 1 ug/kg/h via INTRAVENOUS
  Administered 2016-11-20: 0.8 ug/kg/h via INTRAVENOUS
  Administered 2016-11-20 – 2016-11-21 (×2): 1.2 ug/kg/h via INTRAVENOUS
  Administered 2016-11-21: 1 ug/kg/h via INTRAVENOUS
  Administered 2016-11-21: 1.4 ug/kg/h via INTRAVENOUS
  Administered 2016-11-21 (×3): 1.2 ug/kg/h via INTRAVENOUS
  Administered 2016-11-21: 1.4 ug/kg/h via INTRAVENOUS
  Filled 2016-11-20 (×14): qty 100

## 2016-11-20 MED ORDER — VECURONIUM BROMIDE 10 MG IV SOLR
10.0000 mg | INTRAVENOUS | Status: DC | PRN
  Administered 2016-11-20: 10 mg via INTRAVENOUS
  Filled 2016-11-20: qty 10

## 2016-11-20 MED ORDER — DEXMEDETOMIDINE HCL IN NACL 200 MCG/50ML IV SOLN
0.4000 ug/kg/h | INTRAVENOUS | Status: DC
  Administered 2016-11-20: 0.4 ug/kg/h via INTRAVENOUS
  Filled 2016-11-20 (×2): qty 50

## 2016-11-20 MED ORDER — ALBUMIN HUMAN 25 % IV SOLN
25.0000 g | Freq: Four times a day (QID) | INTRAVENOUS | Status: AC
  Administered 2016-11-20 – 2016-11-21 (×4): 25 g via INTRAVENOUS
  Filled 2016-11-20 (×4): qty 100
  Filled 2016-11-20 (×2): qty 50
  Filled 2016-11-20: qty 100

## 2016-11-20 NOTE — Progress Notes (Signed)
RT note-ETT advanced 3cm per Dr. Molli KnockYacoub

## 2016-11-20 NOTE — Procedures (Signed)
Admit: 11/08/2016 LOS: 11  78M dialysis dependent AKI VDRA 2/2 Pna / Flu /COPD  Current CRRT Prescription: Start RRT Date: 11/23/2016 Catheter: R IJ Nontunneled placed 12/01/2016 BFR: 200  Pre Blood Pump: 300 4K DFR: 1500 4K Replacement Rate: 300 4K Goal UF: -140m/hr Anticoagulation: No heparin in circuit Clotting: ~q12h   S: K up a little this AM No other changes; on/off NE Sedated and full vent settings  O: 02/12 0701 - 02/13 0700 In: 4442.4 [I.V.:2162.4; NG/GT:1680; IV Piggyback:600] Out: 5062 [Urine:220]  Filed Weights   11/18/16 0345 11/19/16 0345 11/26/2016 0500  Weight: (!) 143.5 kg (316 lb 5.8 oz) (!) 138.5 kg (305 lb 5.4 oz) (!) 138.6 kg (305 lb 8.9 oz)     Recent Labs Lab 11/19/16 0410 11/19/16 1348 11/29/2016 0500  NA 135 134* 132*  K 5.0 5.0 5.4*  CL 99* 101 100*  CO2 26 24 22   GLUCOSE 182* 221* 160*  BUN 50* 54* 55*  CREATININE 1.11 1.16 1.42*  CALCIUM 8.7* 8.5* 8.6*  PHOS 4.3 4.4 5.0*    Recent Labs Lab 11/17/16 0432 11/18/16 0415 11/19/16 0410  WBC 19.1* 21.5* 23.0*  HGB 7.1* 7.0* 8.8*  HCT 23.0* 22.9* 28.1*  MCV 90.6 92.0 88.6  PLT 103* 114* 124*    Scheduled Meds: . sodium chloride   Intravenous Once  . budesonide (PULMICORT) nebulizer solution  0.5 mg Nebulization BID  . cefTAZidime (FORTAZ)  IV  2 g Intravenous Q12H  . chlorhexidine gluconate (MEDLINE KIT)  15 mL Mouth Rinse BID  . Chlorhexidine Gluconate Cloth  6 each Topical Daily  . feeding supplement (PRO-STAT SUGAR FREE 64)  30 mL Per Tube BID  . fentaNYL (SUBLIMAZE) injection  50 mcg Intravenous Once  . hydrocortisone sod succinate (SOLU-CORTEF) inj  100 mg Intravenous Q8H  . insulin aspart  0-20 Units Subcutaneous Q4H  . ipratropium-albuterol  3 mL Nebulization Q6H  . mouth rinse  15 mL Mouth Rinse QID  . pantoprazole (PROTONIX) IV  40 mg Intravenous Q12H  . sodium chloride flush  10-40 mL Intracatheter Q12H  . vancomycin  1,500 mg Intravenous Q24H   Continuous Infusions: .  fentaNYL infusion INTRAVENOUS 350 mcg/hr (11/08/2016 0749)  . Study Medication - Expanded Acess for LJPC-501 Stopped (11/15/16 0800)  . norepinephrine (LEVOPHED) Adult infusion 5 mcg/min (11/12/2016 0700)  . dialysis replacement fluid (prismasate) 300 mL/hr at 11/19/16 1121  . dialysis replacement fluid (prismasate) 300 mL/hr at 11/19/16 1933  . dialysate (PRISMASATE) 1,500 mL/hr at 12/02/2016 0748  . propofol (DIPRIVAN) infusion 50 mcg/kg/min (11/29/2016 0748)   PRN Meds:.sodium chloride, acetaminophen, albuterol, fentaNYL, fentaNYL (SUBLIMAZE) injection, heparin, heparin, ondansetron (ZOFRAN) IV, sennosides, sodium chloride, sodium chloride flush, vecuronium  ABG    Component Value Date/Time   PHART 7.300 (L) 11/19/2016 0410   PCO2ART 52.5 (H) 11/19/2016 0410   PO2ART 68.3 (L) 11/19/2016 0410   HCO3 25.1 11/19/2016 0410   TCO2 32 11/15/2016 1543   ACIDBASEDEF 0.5 11/19/2016 0410   O2SAT 92.8 11/19/2016 0410    A/P  1. Dialysis dependent AKI presumed ATn: Change Pre to 1000 and post to 500 to inc clearance.  Follow PM labs.   2. COPD / VDRF / Flu A 3. Shock / Hypotension 4. Anemia: transfused 2/11; follow  RPearson Grippe MD [facility] pgr [phone]

## 2016-11-20 NOTE — Progress Notes (Signed)
PULMONARY / CRITICAL CARE MEDICINE   Name: Henry Watts MRN: [id number] DOB: 02-01-1961    LOS: 10 days  HISTORY OF PRESENT ILLNESS:   56 y/o man with a PMH of COPD. Initially admitted to [facility] 1/31 with SOB thought r/t LLL PNA, treated with bipap.  Improved initially but developed worsening resp status as well as AKI and hyperkalemia and was tx to [facility] 2/2.    SUBJECTIVE:  Patient remains on vent. No acute events overnight.   VITAL SIGNS: BP (!) 103/47   Pulse 93   Temp 98.8 F (37.1 C) (Oral)   Resp 20   Ht 5\' 8"  (1.727 m)   Wt (!) 305 lb 8.9 oz (138.6 kg)   SpO2 93%   BMI 46.46 kg/m   HEMODYNAMICS:    VENTILATOR SETTINGS: Vent Mode: PRVC FiO2 (%):  [40 %] 40 % Set Rate:  [22 bmp] 22 bmp Vt Set:  [600 mL] 600 mL PEEP:  [5 cmH20] 5 cmH20 Plateau Pressure:  [16 cmH20-29 cmH20] 28 cmH20  INTAKE / OUTPUT: I/O last 3 completed shifts: In: 6918.5 [I.V.:3168.5; Blood:420; NG/GT:2680; IV Piggyback:650] Out: 9450 [Urine:575; Other:8875]  PHYSICAL EXAMINATION: General:  Obese, chronically ill appearing male, NAD, sedated on vent  Neuro: sedated on vent, RASS -3, does not open eyes or respond to commands CV: RRR, no murmurs appreciated PULM: even/non-labored on vent, coarse breath sounds bilaterally GI: rotund, mildly distended, +BM Extremities: warm/dry, trace BLE edema, SCDs in place  Skin: Scrotal edema with ecchymosis   LABS:  BMET  Recent Labs Lab 11/19/16 0410 11/19/16 1348 11/25/2016 0500  NA 135 134* 132*  K 5.0 5.0 5.4*  CL 99* 101 100*  CO2 26 24 22   BUN 50* 54* 55*  CREATININE 1.11 1.16 1.42*  GLUCOSE 182* 221* 160*   Electrolytes  Recent Labs Lab 11/18/16 0415  11/19/16 0410 11/19/16 1348 12/02/2016 0500  CALCIUM 8.7*  8.7*  < > 8.7* 8.5* 8.6*  MG 2.4  --  2.6*  --  2.4  PHOS 3.1  < > 4.3 4.4 5.0*  < > = values in this interval not displayed.  CBC  Recent Labs Lab 11/17/16 0432 11/18/16 0415 11/19/16 0410  WBC 19.1* 21.5*  23.0*  HGB 7.1* 7.0* 8.8*  HCT 23.0* 22.9* 28.1*  PLT 103* 114* 124*   Coags  Recent Labs Lab 11/13/16 1415 11/13/16 1522  11/17/16 0432 11/18/16 0415 11/19/16 0410 11/19/2016 0500  APTT  --  26  < > 24 25 26 27   INR 1.31 1.34  --  1.09  --   --   --   < > = values in this interval not displayed.  Sepsis Markers  Recent Labs Lab 11/13/16 0842 11/14/16 0345 11/15/16 0227 11/17/16 1313  LATICACIDVEN 4.1*  --   --  1.4  PROCALCITON 0.89 1.41 0.35  --    ABG  Recent Labs Lab 11/17/16 0410 11/18/16 0400 11/19/16 0410  PHART 7.288* 7.302* 7.300*  PCO2ART 62.2* 29.1* 52.5*  PO2ART 95.8 68.5* 68.3*    Liver Enzymes  Recent Labs Lab 11/16/16 0604  11/17/16 0432  11/18/16 0415  11/19/16 0410 11/19/16 1348 11/28/2016 0500  AST 23  --  29  --  32  --   --   --   --   ALT 53  --  53  --  56  --   --   --   --   ALKPHOS 40  --  50  --  54  --   --   --   --   BILITOT 0.5  --  0.6  --  0.6  --   --   --   --   ALBUMIN 2.0*  < > 2.2*  < > 2.4*  2.4*  < > 2.3* 2.1* 2.2*  < > = values in this interval not displayed.  Cardiac Enzymes No results for input(s): TROPONINI, PROBNP in the last 168 hours.  Glucose  Recent Labs Lab 11/19/16 1124 11/19/16 1557 11/19/16 2022 11/19/16 2301 11/22/2016 0502 11/17/2016 0740  GLUCAP 123* 208* 165* 72 146* 149*   Imaging No results found.   STUDIES:  Echo 2/5 - EF 55-60%, G1DD  CULTURES: MRSA 2/2 > (-) [facility] RSV 2/4 >> POS Flu A  Blood 2/4 >> NGx4d Trach 2/4 >> respiratory flora only - FINAL. C.diff >> neg  ANTIBIOTICS: [facility] Vanc 2/4 >>>2/7 >> resumed 2/12 Meropenem 2/4 >>>2/8 Levofloxacin 2/8>>>2/11 Tamiflu 2/4>>> 2/10 Ceftaz 2/9>>>  SIGNIFICANT EVENTS: 1/31 admit at  for SOB. Rx as PNA 2[facility], resp fx 2/6 shock, some bleeding from rt fem a line (hematoma), 3 units prbc, ffp, refractory shock resolved by angiotensin  infusion 2/12 evaluated by GI, further work up would be low yield; C.diff neg  LINES/TUBES: RIJ Temp HD Cath 2/4> LIJ CVC 2/5> R femoral art line 2/4>2/6 L fem aline 2/6>>> ETT 2/4>  DISCUSSION: 56 y/o man with COPD exacerbation +/- pneumonia, developing renal and resp failure. [facility]. Intubated. On CVVH.   ASSESSMENT / PLAN:  PULMONARY A: Acute on Chronic Hypoxemic Hypercapneic resp failure - multifactorial r/t Flu, AECOPD, PNA +/- untreated OSA  Hx OSA - noncompliant with CPAP  P:   Vent support     Pulmicort BID, Duoneb q6 Neg balance    CARDIAC A:  Sinus tachycardia Volume overload Shock - presume septic , resolved 2/10 Echo 2/6 - EF 65-70%, G1DD  A.fib with RVR - resolved Anemia P : Fluid removal as tolerated via CVVHD per renal Continue stress-dose steroids -- on chronic pred for COPD  S/p angiotensin II (LJPC-501) infusion, could restart if needed Currently off pressors  RENAL Lab Results  Component Value Date   CREATININE 1.42 (H) 12/02/2016   CREATININE 1.16 11/19/2016   CREATININE 1.11 11/19/2016    Recent Labs Lab 11/19/16 0410 11/19/16 1348 11/30/2016 0500  K 5.0 5.0 5.4*    A:   AKI   Hyperkalemia - 5.4 this AM.   P:   CVVHD per renal  BMP  INFECTIOUS A:   Concern for RLL PNA Flu A POS s/p Tamiflu course Procalcitonin WNL Leukocytosis - worsening.  P:   Continue ceftazidime, vanc  GI:  Constipation CT abd 2/6 with no signs of retro bleed. FOBT positive Further work-up low yield per GI C.diff neg P:  TF Pepcid BID LFT wnl Lactulose, Miralax    HEME:   Recent Labs  11/18/16 0415 11/19/16 0410  HGB 7.0* 8.8*   Leukocytosis - better  Anemia - severe but improving. Hematoma from R femoral line possibly contributing. Drop plat (conusmption, diltution) - continues to improve P:  Follow CBC  SCDs Fluid removal should increase HCT  ENDOCRINE CBG (last 3)   Recent Labs   11/19/16 2301 12/04/2016 0502 11/30/2016 0740  GLUCAP 72 146* 149*   A: DM AI P: SSI  Stress steroids   NEUROLOGIC  A: Chronic Pain.  Anxiety Sedation needs on vent  P: RASS goal: -1 Continue fentanyl gtt, propofol gtt Vecuronium q2 PRN   Tarri Abernethy, MD, MPH PGY-2 [facility]  Attending Note:  56 year old male with COPD and flu A who developed respiratory failure and was intubated with subsequent renal failure requiring CRRT.  Patient is very dyssynchronous with the vent.  On exam, a prolonged respiratory phase.  CXR that I reviewed myself, will ETT in 8 cm away from carina, will advance.  Spoke with wife and daughters, the patient would not want trach/peg.  Will D/C vecuronium and propofol, start precedex.  Change vent mode to PCV 10/5, PEEP 5 and Fio2 of 40%.  Will give albumin x 4 doses and keep volume negative.  The patient is critically ill with multiple organ systems failure and requires high complexity decision making for assessment and support, frequent evaluation and titration of therapies, application of advanced monitoring technologies and extensive interpretation of multiple databases.   Critical Care Time devoted to patient care services described in this note is  35  Minutes. This time reflects time of care of this signee Dr Koren Bound. This critical care time does not reflect procedure time, or teaching time or supervisory time of PA/NP/Med student/Med Resident etc but could involve care discussion time.  Alyson Reedy, M.D. [facility] Pulmonary/Critical Care Medicine. Pager: [phone]. After hours pager: [phone].

## 2016-11-21 DIAGNOSIS — Z515 Encounter for palliative care: Secondary | ICD-10-CM

## 2016-11-21 LAB — CULTURE, BLOOD (ROUTINE X 2)
Culture: NO GROWTH
Culture: NO GROWTH

## 2016-11-21 LAB — BLOOD GAS, ARTERIAL
Acid-base deficit: 1.9 mmol/L (ref 0.0–2.0)
BICARBONATE: 23.6 mmol/L (ref 20.0–28.0)
Drawn by: 449561
FIO2: 50
LHR: 20 {breaths}/min
O2 SAT: 99.3 %
PATIENT TEMPERATURE: 98.6
PEEP: 8 cmH2O
PH ART: 7.303 — AB (ref 7.350–7.450)
VT: 550 mL
pCO2 arterial: 49.2 mmHg — ABNORMAL HIGH (ref 32.0–48.0)
pO2, Arterial: 165 mmHg — ABNORMAL HIGH (ref 83.0–108.0)

## 2016-11-21 LAB — GLUCOSE, CAPILLARY
GLUCOSE-CAPILLARY: 159 mg/dL — AB (ref 65–99)
GLUCOSE-CAPILLARY: 125 mg/dL — AB (ref 65–99)
Glucose-Capillary: 157 mg/dL — ABNORMAL HIGH (ref 65–99)
Glucose-Capillary: 182 mg/dL — ABNORMAL HIGH (ref 65–99)

## 2016-11-21 LAB — CBC
HEMATOCRIT: 23.8 % — AB (ref 39.0–52.0)
HEMOGLOBIN: 7.5 g/dL — AB (ref 13.0–17.0)
MCH: 28.2 pg (ref 26.0–34.0)
MCHC: 31.5 g/dL (ref 30.0–36.0)
MCV: 89.5 fL (ref 78.0–100.0)
Platelets: 127 10*3/uL — ABNORMAL LOW (ref 150–400)
RBC: 2.66 MIL/uL — ABNORMAL LOW (ref 4.22–5.81)
RDW: 17.9 % — AB (ref 11.5–15.5)
WBC: 13.7 10*3/uL — AB (ref 4.0–10.5)

## 2016-11-21 LAB — RENAL FUNCTION PANEL
ALBUMIN: 2.8 g/dL — AB (ref 3.5–5.0)
ANION GAP: 12 (ref 5–15)
BUN: 55 mg/dL — ABNORMAL HIGH (ref 6–20)
CHLORIDE: 99 mmol/L — AB (ref 101–111)
CO2: 23 mmol/L (ref 22–32)
Calcium: 8.7 mg/dL — ABNORMAL LOW (ref 8.9–10.3)
Creatinine, Ser: 1.11 mg/dL (ref 0.61–1.24)
Glucose, Bld: 172 mg/dL — ABNORMAL HIGH (ref 65–99)
PHOSPHORUS: 5.2 mg/dL — AB (ref 2.5–4.6)
POTASSIUM: 5.5 mmol/L — AB (ref 3.5–5.1)
Sodium: 134 mmol/L — ABNORMAL LOW (ref 135–145)

## 2016-11-21 LAB — APTT: aPTT: 25 seconds (ref 24–36)

## 2016-11-21 LAB — MAGNESIUM: Magnesium: 2.4 mg/dL (ref 1.7–2.4)

## 2016-11-21 MED ORDER — MIDAZOLAM BOLUS VIA INFUSION (WITHDRAWAL LIFE SUSTAINING TX)
5.0000 mg | INTRAVENOUS | Status: DC | PRN
  Administered 2016-11-21 (×3): 5 mg via INTRAVENOUS
  Filled 2016-11-21: qty 20

## 2016-11-21 MED ORDER — FENTANYL BOLUS VIA INFUSION
50.0000 ug | INTRAVENOUS | Status: DC | PRN
  Administered 2016-11-21 (×2): 100 ug via INTRAVENOUS
  Administered 2016-11-21: 200 ug via INTRAVENOUS
  Filled 2016-11-21: qty 200

## 2016-11-21 MED ORDER — FENTANYL CITRATE (PF) 2500 MCG/50ML IJ SOLN
100.0000 ug/h | INTRAMUSCULAR | Status: DC
  Filled 2016-11-21: qty 50

## 2016-11-21 MED ORDER — SODIUM CHLORIDE 0.9 % IV SOLN
10.0000 mg/h | INTRAVENOUS | Status: DC
  Administered 2016-11-21: 10 mg/h via INTRAVENOUS
  Filled 2016-11-21: qty 10

## 2016-11-22 ENCOUNTER — Telehealth: Payer: Self-pay

## 2016-11-22 NOTE — Telephone Encounter (Signed)
On 11/22/16 I received a death certificate from [facility] [employer] (faxed). The death certificate is for cremation. The patient is a patient of Doctor Molli KnockYacoub. The [facility] tomorrow am for signature.  On 11/23/16 I received the death certificate back from Doctor Kendrick FriesMcQuaid who signed it for Doctor Molli KnockYacoub since Doctor Molli KnockYacoub is on vacation. I got the death certificate ready and called the funeral home to let them know I faxed the death certificate to the funeral home per the funeral home request.

## 2016-11-24 LAB — CULTURE, BLOOD (ROUTINE X 2)
Culture: NO GROWTH
Culture: NO GROWTH

## 2016-11-29 ENCOUNTER — Telehealth: Payer: Self-pay

## 2016-11-29 NOTE — Telephone Encounter (Signed)
On 11/29/16 I received a death certificate from [facility] [employer] (original). The death certificate is for cremation. The patient is a patient of Doctor McQuaid. The death certificate will be taken to Pulmonary Unit @ [facility] this pm for signature.  On 11/30/16 I received the death certificate back from Doctor McQuaid. I got the death certificate ready and called the funeral home to let them know Alona BeneJoyce with [employer] will be by to pickup the death certificate.

## 2016-12-06 NOTE — Progress Notes (Addendum)
Patient's family are at the bedside preparing for withdrawal of care .Emotional support given and chaplain paged and is coming to the bedside

## 2016-12-06 NOTE — Progress Notes (Signed)
Visited w/ several family members in rm as pt about to be transitioned to comfort care. Pt's wife, children and some of his 20 grandchildren are here, a close and loving family w/ strong faith (We know he'll be in a better place, but we'll miss him so much).. A daughter has been in touch [place]/ [place], and their church members are praying for them. They were appreciative of prayer. Advised them they can ask nurse to page for chaplain as needed.   06/03/2017 1400  Clinical Encounter Type  Visited With Patient and family together  Visit Type Initial;Psychological support;Spiritual support;Social support;Critical Care  Referral From Nurse  Spiritual Encounters  Spiritual Needs Prayer;Emotional;Grief support  Stress Factors  Family Stress Factors Loss   Ephraim Hamburgerynthia A Vann Okerlund, [address]

## 2016-12-06 NOTE — Procedures (Signed)
Admit: 11/26/2016 LOS: 12  63M dialysis dependent AKI VDRA 2/2 Pna / Flu /COPD  Current CRRT Prescription: Start RRT Date: 12/02/2016 Catheter: R IJ Nontunneled placed 11/08/2016 BFR: 200  Pre Blood Pump: 300 4K DFR: 1500 4K Replacement Rate: 300 4K Goal UF: -126m/hr Anticoagulation: No heparin in circuit Clotting: ~q12h   S: K up a little this AM No other changes; on/off NE Having venous line issues / high pressures / still clotting regularly  O: 02/13 0701 - 02/14 0700 In: 4494.4 [I.V.:1884.4; NG/GT:1660; IV Piggyback:950] Out: 5970 [Urine:565]  Filed Weights   11/19/16 0345 12/04/2016 0500 003-12-180500  Weight: (!) 138.5 kg (305 lb 5.4 oz) (!) 138.6 kg (305 lb 8.9 oz) (!) 136.2 kg (300 lb 4.3 oz)     Recent Labs Lab 11/16/2016 0500 12/01/2016 1525 003/12/180430  NA 132* 133* 134*  K 5.4* 5.2* 5.5*  CL 100* 102 99*  CO2 22 23 23   GLUCOSE 160* 197* 172*  BUN 55* 53* 55*  CREATININE 1.42* 1.31* 1.11  CALCIUM 8.6* 8.4* 8.7*  PHOS 5.0* 5.3* 5.2*    Recent Labs Lab 11/18/16 0415 11/19/16 0410 02018/03/120430  WBC 21.5* 23.0* 13.7*  HGB 7.0* 8.8* 7.5*  HCT 22.9* 28.1* 23.8*  MCV 92.0 88.6 89.5  PLT 114* 124* 127*    Scheduled Meds: . sodium chloride   Intravenous Once  . budesonide (PULMICORT) nebulizer solution  0.5 mg Nebulization BID  . cefTAZidime (FORTAZ)  IV  2 g Intravenous Q12H  . chlorhexidine gluconate (MEDLINE KIT)  15 mL Mouth Rinse BID  . Chlorhexidine Gluconate Cloth  6 each Topical Daily  . feeding supplement (PRO-STAT SUGAR FREE 64)  30 mL Per Tube BID  . fentaNYL (SUBLIMAZE) injection  50 mcg Intravenous Once  . hydrocortisone sod succinate (SOLU-CORTEF) inj  100 mg Intravenous Q8H  . insulin aspart  0-20 Units Subcutaneous Q4H  . ipratropium-albuterol  3 mL Nebulization Q6H  . mouth rinse  15 mL Mouth Rinse QID  . pantoprazole (PROTONIX) IV  40 mg Intravenous Q12H  . sodium chloride flush  10-40 mL Intracatheter Q12H  . vancomycin  1,500 mg  Intravenous Q24H   Continuous Infusions: . dexmedetomidine 1.2 mcg/kg/hr (02018/03/120800)  . feeding supplement (VITAL HIGH PROTEIN) 1,000 mL (11/13/2016 2015)  . fentaNYL infusion INTRAVENOUS 300 mcg/hr (012-Mar-20180800)  . midazolam (VERSED) infusion 2 mg/hr (0March 12, 20180800)  . norepinephrine (LEVOPHED) Adult infusion 3 mcg/min (003-12-180800)  . dialysis replacement fluid (prismasate) 1,000 mL/hr at 003/12/180721  . dialysis replacement fluid (prismasate) 500 mL/hr at 012-Mar-20180204  . dialysate (PRISMASATE) 1,500 mL/hr at 003-12-180800   PRN Meds:.sodium chloride, acetaminophen, albuterol, fentaNYL, fentaNYL (SUBLIMAZE) injection, heparin, heparin, midazolam, ondansetron (ZOFRAN) IV, sennosides, sodium chloride, sodium chloride flush, vecuronium  ABG    Component Value Date/Time   PHART 7.303 (L) 003-12-20180554   PCO2ART 49.2 (H) 0Mar 12, 20180554   PO2ART 165 (H) 02018-03-120554   HCO3 23.6 003-12-20180554   TCO2 32 11/15/2016 1543   ACIDBASEDEF 1.9 003-12-20180554   O2SAT 99.3 003-12-180554    A/P  1. Dialysis dependent AKI presumed ATn: No changes today 2. COPD / VDRF / Flu A 3. Shock / Hypotension 4. Anemia: transfused 2/11; follow 5. Mild hyperkalemia, at least in part from stuttering CRRT 2/2 access / clotting issues  RPearson Grippe MD [facility] pgr [phone]

## 2016-12-06 NOTE — Procedures (Signed)
Extubation Procedure Note  Patient Details:   Name: Consuella LoseJohn R Mones DOB: 01/22/1961 MRN: [id number]   Airway Documentation:     Evaluation  O2 sats: transiently fell during during procedure Complications: Complications of terminal extubation sats of 78% Patient did tolerate procedure well. Bilateral Breath Sounds: Diminished   Yes  Morley KosJohnson, Antionio Negron Leroy 05-16-2017, 3:40 PM

## 2016-12-06 NOTE — Progress Notes (Signed)
PULMONARY / CRITICAL CARE MEDICINE   Name: Henry Watts MRN: [id number] DOB: 03/07/1961    LOS: 10 days  HISTORY OF PRESENT ILLNESS:   56 y/o man with a PMH of COPD. [facility] with SOB thought r/t LLL PNA, treated with bipap.  Improved initially but developed worsening resp status as well as AKI and hyperkalemia and was tx to [facility] 2/2.    SUBJECTIVE:  No acute events overnight. Patient remains on vent. Per discussion with family yesterday, will reevaluate patient's respiratory status tomorrow and consider transition to comfort care if no improvement.   VITAL SIGNS: BP (!) 130/57   Pulse 76   Temp 97.7 F (36.5 C) (Oral)   Resp 10   Ht 5\' 8"  (1.727 m)   Wt (!) 300 lb 4.3 oz (136.2 kg)   SpO2 100%   BMI 45.66 kg/m   HEMODYNAMICS:    VENTILATOR SETTINGS: Vent Mode: PRVC FiO2 (%):  [40 %-60 %] 50 % Set Rate:  [5 bmp-20 bmp] 20 bmp Vt Set:  [550 mL] 550 mL PEEP:  [8 cmH20] 8 cmH20 Plateau Pressure:  [18 cmH20-28 cmH20] 21 cmH20  INTAKE / OUTPUT: I/O last 3 completed shifts: In: 6463.5 [I.V.:3013.5; NG/GT:2500; IV Piggyback:950] Out: 8049 [Urine:565; Other:7484]  PHYSICAL EXAMINATION: General:  Obese, chronically ill appearing male, NAD, sedated on vent  Neuro: sedated on vent, RASS -3, does not open eyes or respond to commands CV: RRR, no murmurs appreciated PULM: even/non-labored on vent, coarse breath sounds bilaterally GI: rotund, mildly distended, +BM Extremities: warm/dry, trace BLE edema, SCDs in place  Skin: Scrotal edema with ecchymosis   LABS:  BMET  Recent Labs Lab 11/17/2016 0500 11/19/2016 1525 30-Jul-2017 0430  NA 132* 133* 134*  K 5.4* 5.2* 5.5*  CL 100* 102 99*  CO2 22 23 23   BUN 55* 53* 55*  CREATININE 1.42* 1.31* 1.11  GLUCOSE 160* 197* 172*   Electrolytes  Recent Labs Lab 11/19/16 0410  11/23/2016 0500 11/23/2016 1525 30-Jul-2017 0430  CALCIUM 8.7*  < > 8.6* 8.4* 8.7*  MG 2.6*  --  2.4  --  2.4  PHOS 4.3  < > 5.0*  5.3* 5.2*  < > = values in this interval not displayed.  CBC  Recent Labs Lab 11/18/16 0415 11/19/16 0410 30-Jul-2017 0430  WBC 21.5* 23.0* 13.7*  HGB 7.0* 8.8* 7.5*  HCT 22.9* 28.1* 23.8*  PLT 114* 124* 127*   Coags  Recent Labs Lab 11/17/16 0432  11/19/16 0410 11/27/2016 0500 30-Jul-2017 0430  APTT 24  < > 26 27 25   INR 1.09  --   --   --   --   < > = values in this interval not displayed.  Sepsis Markers  Recent Labs Lab 11/15/16 0227 11/17/16 1313  LATICACIDVEN  --  1.4  PROCALCITON 0.35  --    ABG  Recent Labs Lab 12/01/2016 1110 11/25/2016 1425 30-Jul-2017 0554  PHART 7.255* 7.284* 7.303*  PCO2ART 56.5* 51.4* 49.2*  PO2ART 65.8* 135* 165*    Liver Enzymes  Recent Labs Lab 11/16/16 0604  11/17/16 0432  11/18/16 0415  11/16/2016 0500 12/02/2016 1525 30-Jul-2017 0430  AST 23  --  29  --  32  --   --   --   --   ALT 53  --  53  --  56  --   --   --   --   ALKPHOS 40  --  50  --  54  --   --   --   --  BILITOT 0.5  --  0.6  --  0.6  --   --   --   --   ALBUMIN 2.0*  < > 2.2*  < > 2.4*  2.4*  < > 2.2* 2.3* 2.8*  < > = values in this interval not displayed.  Cardiac Enzymes No results for input(s): TROPONINI, PROBNP in the last 168 hours.  Glucose  Recent Labs Lab 11/11/2016 0740 11/17/2016 1241 12/02/2016 2028 12/01/2016 0021 12/05/2016 0415 11/17/2016 0725  GLUCAP 149* 185* 130* 182* 157* 159*   Imaging No results found.   STUDIES:  Echo 2/5 - EF 55-60%, G1DD  CULTURES: MRSA 2/2 > (-) Blood culture and flu (-) at [facility] RSV 2/4 >> POS Flu A  Blood 2/4 >> NGx4d Trach 2/4 >> respiratory flora only - FINAL. C.diff >> neg  ANTIBIOTICS: Vanc and Levaquin at [facility] 2/4 >>>2/7 >> resumed 2/12 Meropenem 2/4 >>>2/8 Levofloxacin 2/8>>>2/11 Tamiflu 2/4>>> 2/10 Ceftaz 2/9>>>  SIGNIFICANT EVENTS: 1/31 admit at [place] for SOB. Rx as PNA 2/2 transferred to [facility] for AKI, resp fx 2/6 shock, some bleeding from rt fem a line (hematoma), 3 units prbc,  ffp, refractory shock resolved by angiotensin infusion 2/12 evaluated by GI, further work up would be low yield; C.diff neg  LINES/TUBES: RIJ Temp HD Cath 2/4> LIJ CVC 2/5> R femoral art line 2/4>2/6 L fem aline 2/6>>> ETT 2/4>  DISCUSSION: 56 y/o man with COPD exacerbation +/- pneumonia, developing renal and resp failure. Transferred to [facility] for further management. Intubated. On CVVH.   ASSESSMENT / PLAN:  PULMONARY A: Acute on Chronic Hypoxemic Hypercapneic resp failure - multifactorial r/t Flu, AECOPD, PNA +/- untreated OSA  Hx OSA - noncompliant with CPAP  P:   Vent support     Pulmicort BID, Duoneb q6 Neg balance    CARDIAC A:  Sinus tachycardia Volume overload Shock - presume septic , resolved 2/10 Echo 2/6 - EF 65-70%, G1DD  A.fib with RVR - resolved Anemia P : Fluid removal as tolerated via CVVHD per renal Continue stress-dose steroids -- on chronic pred for COPD  S/p angiotensin II (LJPC-501) infusion, could restart if needed On levophed  RENAL Lab Results  Component Value Date   CREATININE 1.11 11/11/2016   CREATININE 1.31 (H) 11/09/2016   CREATININE 1.42 (H) 12/04/2016    Recent Labs Lab 11/22/2016 0500 12/02/2016 1525 11/09/2016 0430  K 5.4* 5.2* 5.5*    A:   AKI   Hyperkalemia - 5.5 this AM.   P:   CVVHD per renal  BMP  INFECTIOUS A:   Concern for RLL PNA Flu A POS s/p Tamiflu course Procalcitonin WNL Leukocytosis - improving.  P:   Continue ceftazidime, vanc  GI:  Constipation CT abd 2/6 with no signs of retro bleed. FOBT positive Further work-up low yield per GI C.diff neg P:  TF Protonix  HEME:   Recent Labs  11/19/16 0410 11/20/2016 0430  HGB 8.8* 7.5*   Leukocytosis - improving Anemia - worsening. Hematoma from R femoral line possibly contributing. Drop plat (conusmption, diltution) - improving P:  Follow CBC  Transfusion threshold <7 SCDs Fluid removal should increase HCT  ENDOCRINE CBG (last 3)   Recent  Labs  12/04/2016 0021 11/30/2016 0415 11/25/2016 0725  GLUCAP 182* 157* 159*   A: DM AI P: SSI  Stress steroids   NEUROLOGIC  A: Chronic Pain.  Anxiety Sedation needs on vent  P: RASS goal: -1 Continue fentanyl gtt, Precedex gtt, Versed gtt Continue  Fentanyl bolus q66min PRN, Versed bolus 1-2mg  q2 PRN Vecuronium q2 PRN  Tarri Abernethy, MD, MPH PGY-2 [facility]  Attending Note:  56 year old male with flu and COPD, VDRF, not improving.  Acute renal failure on CRRT.  Removing 100 ml/hr.  Spoke with family yesterday, full DNR.  On exam, prolonged expiratory phase with a very quite chest.  I reviewed CXR myself, ETT ok and infiltrate noted.  Will continue full vent support.  Family to arrive in AM for discussion regarding withdraw.  Continue abx for now.  F/U on cultures.  Replace electrolytes.  Titrate O2 and PEEP.  The patient is critically ill with multiple organ systems failure and requires high complexity decision making for assessment and support, frequent evaluation and titration of therapies, application of advanced monitoring technologies and extensive interpretation of multiple databases.   Critical Care Time devoted to patient care services described in this note is  35  Minutes. This time reflects time of care of this signee Dr Koren Bound. This critical care time does not reflect procedure time, or teaching time or supervisory time of PA/NP/Med student/Med Resident etc but could involve care discussion time.  Alyson Reedy, M.D. [facility] Pulmonary/Critical Care Medicine. Pager: [phone]. After hours pager: [phone].

## 2016-12-06 NOTE — Progress Notes (Signed)
40 mls versed and 75 mls fentanyl wasted and witnessed by Hazel Samshristian Smith RN

## 2016-12-06 NOTE — Progress Notes (Signed)
Nutrition Brief Note  Chart reviewed. Pt now transitioning to comfort care.  No further nutrition interventions warranted at this time.  Please re-consult as needed.    Kimberly Harris, RD, LDN, CNSC Pager [phone] After Hours Pager [phone]    

## 2016-12-06 NOTE — Progress Notes (Signed)
Called by bedside RN, patient's wife and daughter are hear asking to talk to me.  We had a prolonged discussion about plan of care and progression or lack there off with the patient's condition.  After discussion, decision was made to proceed with withdrawal of care.  Will increase fentanyl and versed for comfort and d/c CRRT and vent.  The patient is critically ill with multiple organ systems failure and requires high complexity decision making for assessment and support, frequent evaluation and titration of therapies, application of advanced monitoring technologies and extensive interpretation of multiple databases.   Critical Care Time devoted to patient care services described in this note is  60  Minutes. This time reflects time of care of this signee Dr Koren BoundWesam Yacoub. This critical care time does not reflect procedure time, or teaching time or supervisory time of PA/NP/Med student/Med Resident etc but could involve care discussion time.  Alyson ReedyWesam G. Yacoub, M.D. [facility] Pulmonary/Critical Care Medicine. Pager: [phone]. After hours pager: [phone].

## 2016-12-06 NOTE — Progress Notes (Signed)
Fentanyl 250 ml bag retumed to Main [facility] at 1800

## 2016-12-06 NOTE — Progress Notes (Signed)
Patient time of death [id number] asytole on monitor and no heart sounds or breath sounds asultated by myself and Hazel Samshristian Smith RN.Family was at bedside . Elink Notified of time of death.

## 2016-12-06 DEATH — deceased

## 2017-01-06 NOTE — Discharge Summary (Signed)
NAME:  Rodney CruiseJENNINGS, Amery               ACCOUNT NO.:  [id number]  MEDICAL RECORD NO.:  [id number]  LOCATION:  2M04C                        FACILITY:  [facility]  PHYSICIAN:  Felipa EvenerWesam Jake Gwendola Hornaday, MD  DATE OF BIRTH:  July 07, 1961  DATE OF ADMISSION:  07/27/2017 DATE OF DISCHARGE:                              DISCHARGE SUMMARY   DEATH SUMMARY  PRIMARY DIAGNOSIS/CAUSE OF DEATH:  Acute exacerbation of chronic obstructive pulmonary disease.  SECONDARY DIAGNOSES:  Acute respiratory failure with hypoxemia and hypercapnia; obstructive sleep apnea and compliant with continuous positive airway pressure; sinus tachycardia; volume overload; acute pulmonary edema; septic shock; atrial fibrillation with rapid ventricular response; anemia; acute kidney injury; hyperkalemia; influenza A positive; fecal occult blood positive; leukocytosis; anemia of chronic disease; diabetes; adrenal insufficiency and chronic pain.  The patient is a 56 year old male with extensive past medical history including severe COPD, [facility] with acute exacerbation of COPD, resulting in acute respiratory failure, requiring intubation.  [facility] to acute renal failure after multiple days being rendered off.  Upon arrival, Nephrology was consulted.  Subsequently, CRT was started.  The patient was brought down to optimal volume; however, continued to fail making any progress with weaning and on day #12, I had an extensive discussion with family about prognosis.  They informed that the patient would not want the tracheostomy or feeding tube, at which point, informed that the next step, if he does not wish for tracheostomy or feeding tube, would be comfort measures, at which point, the patient was made do not resuscitate.  Family was not quite ready at that time.  On November 21, 2016, I was called bedside, but the nurse, patient, wife and daughter were here and  asked him to speak with me.  After we discussed the entire case and plan of care, decision was made to proceed with comfort care.  At which point, fentanyl and Versed were increased to ensure comfort and the patient was extubated and expired shortly thereafter.     Felipa EvenerWesam Jake Caeden Foots, MD     WJY/MEDQ  D:  12/18/2016  T:  12/19/2016  Job:  [id number]

## 2017-11-16 IMAGING — CR DG CHEST 1V PORT
1 series · 1 of 1 positions shown · non-contrast
Comparison: 11/14/2016

CLINICAL DATA: Pulmonary edema

EXAM:
PORTABLE CHEST 1 VIEW

[AP]
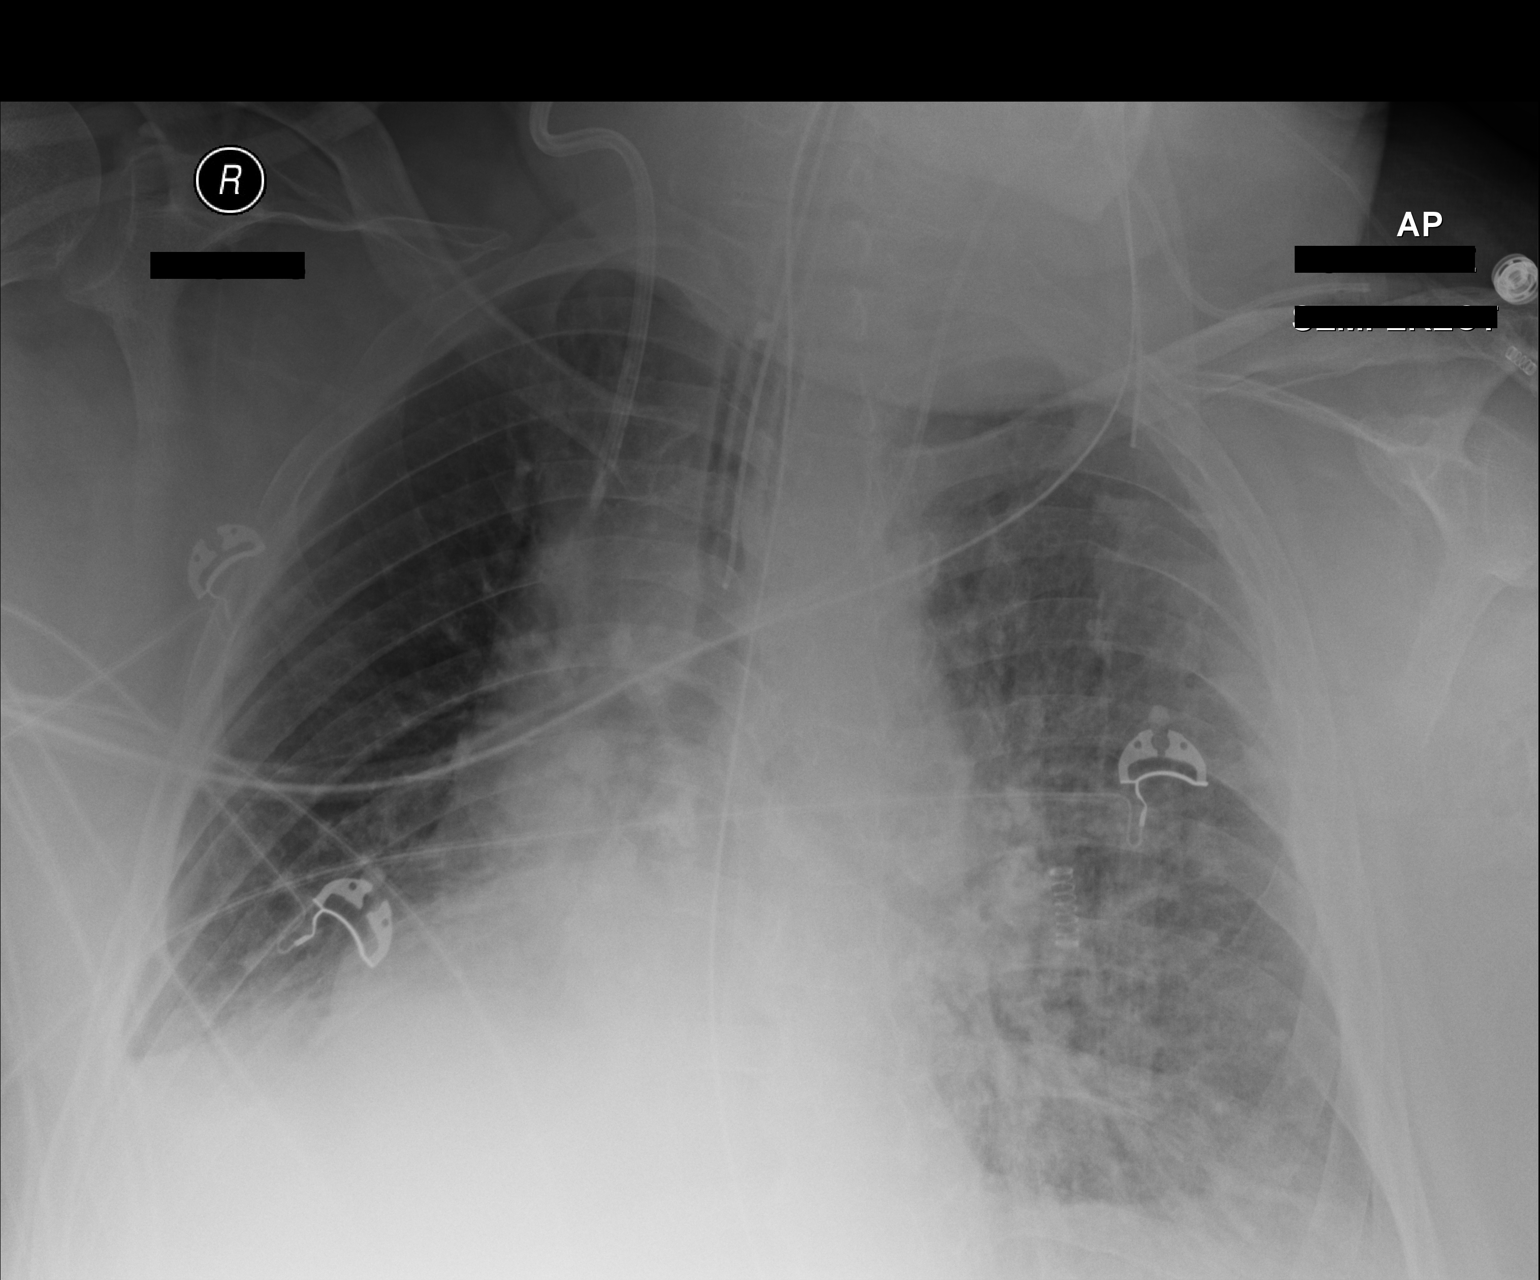

[1 of 1 positions shown; findings below may reference images not displayed]

FINDINGS: Cardiac shadow is stable. Endotracheal tube, nasogastric catheter
and bilateral jugular central lines are again seen and stable. Lungs
are well aerated bilaterally. Bibasilar opacities are again
identified. Generalized increased density is noted over the left
chest likely related to a posterior fusion. Small right effusion is
noted.
IMPRESSION: Stable bibasilar densities.

Tubes and lines as described above.

Changes consistent with bilateral pleural effusions.

## 2017-11-17 IMAGING — DX DG CHEST 1V PORT
2 series · 2 of 2 positions shown · non-contrast
Comparison: 11/15/2016 and earlier.

CLINICAL DATA: 55-year-old male intubated, possible pulmonary
edema. Initial encounter.

EXAM:
PORTABLE CHEST 1 VIEW

[chest ap (1 of 2)]
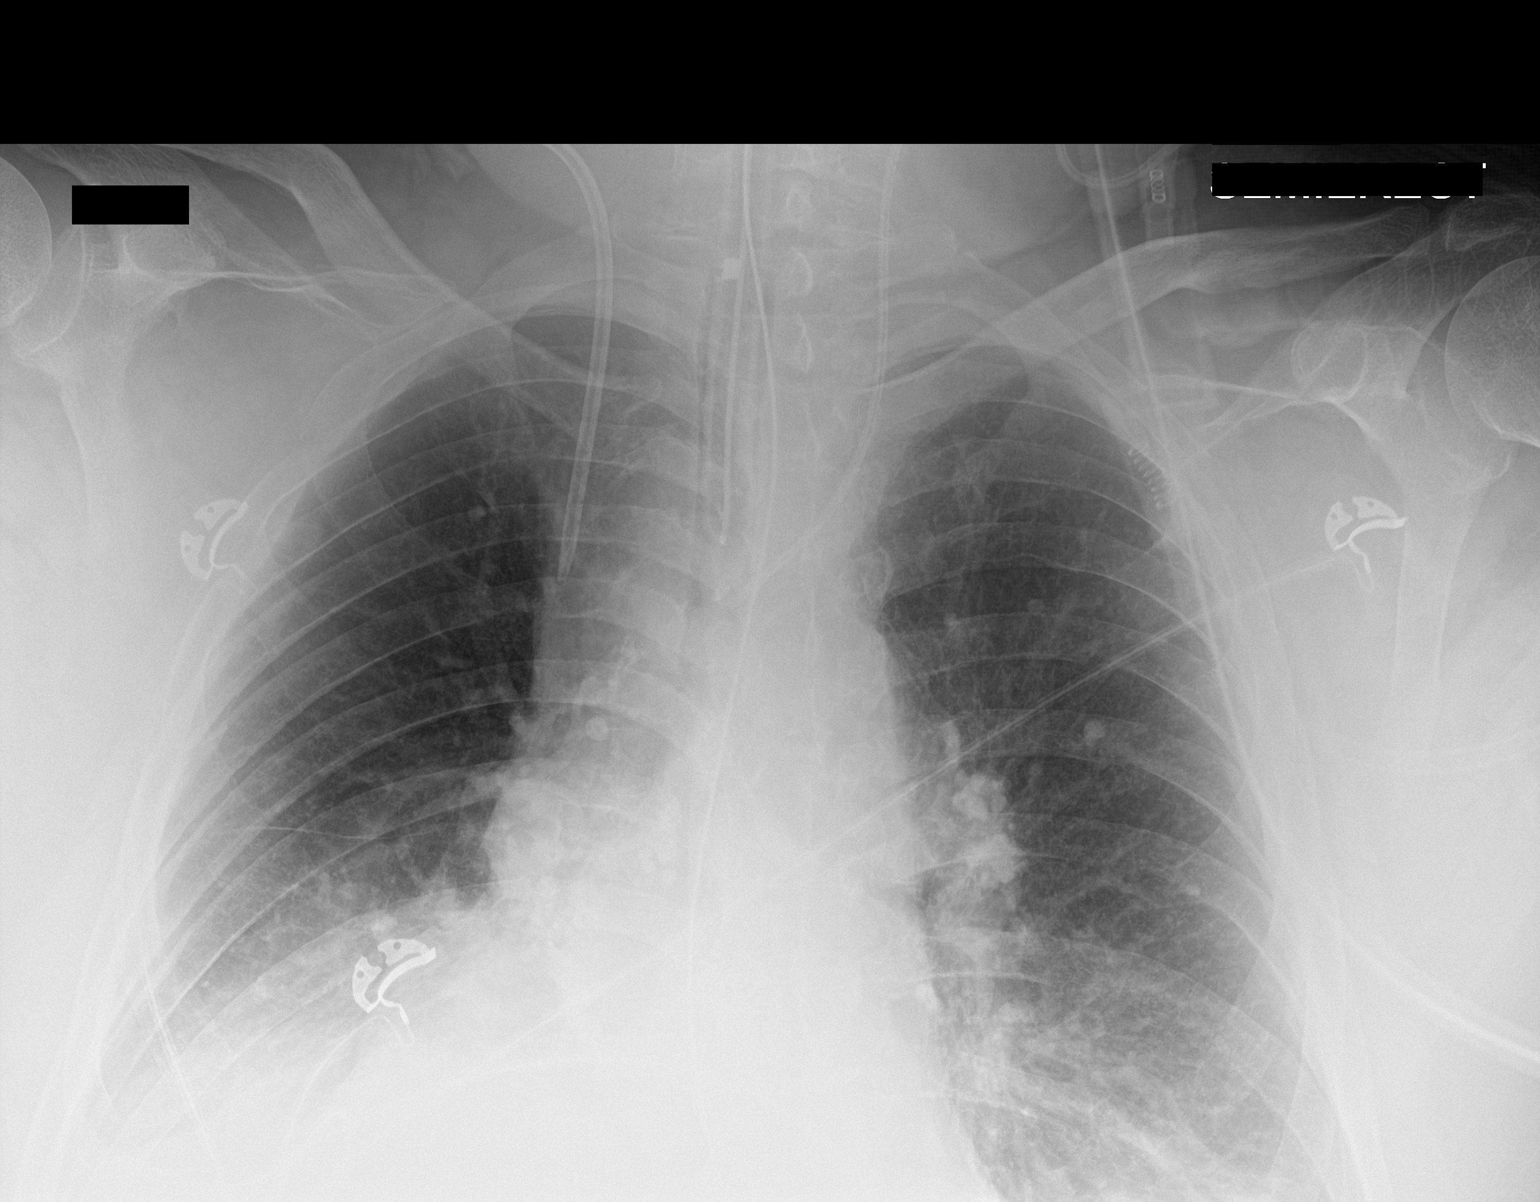

[chest ap (2 of 2)]
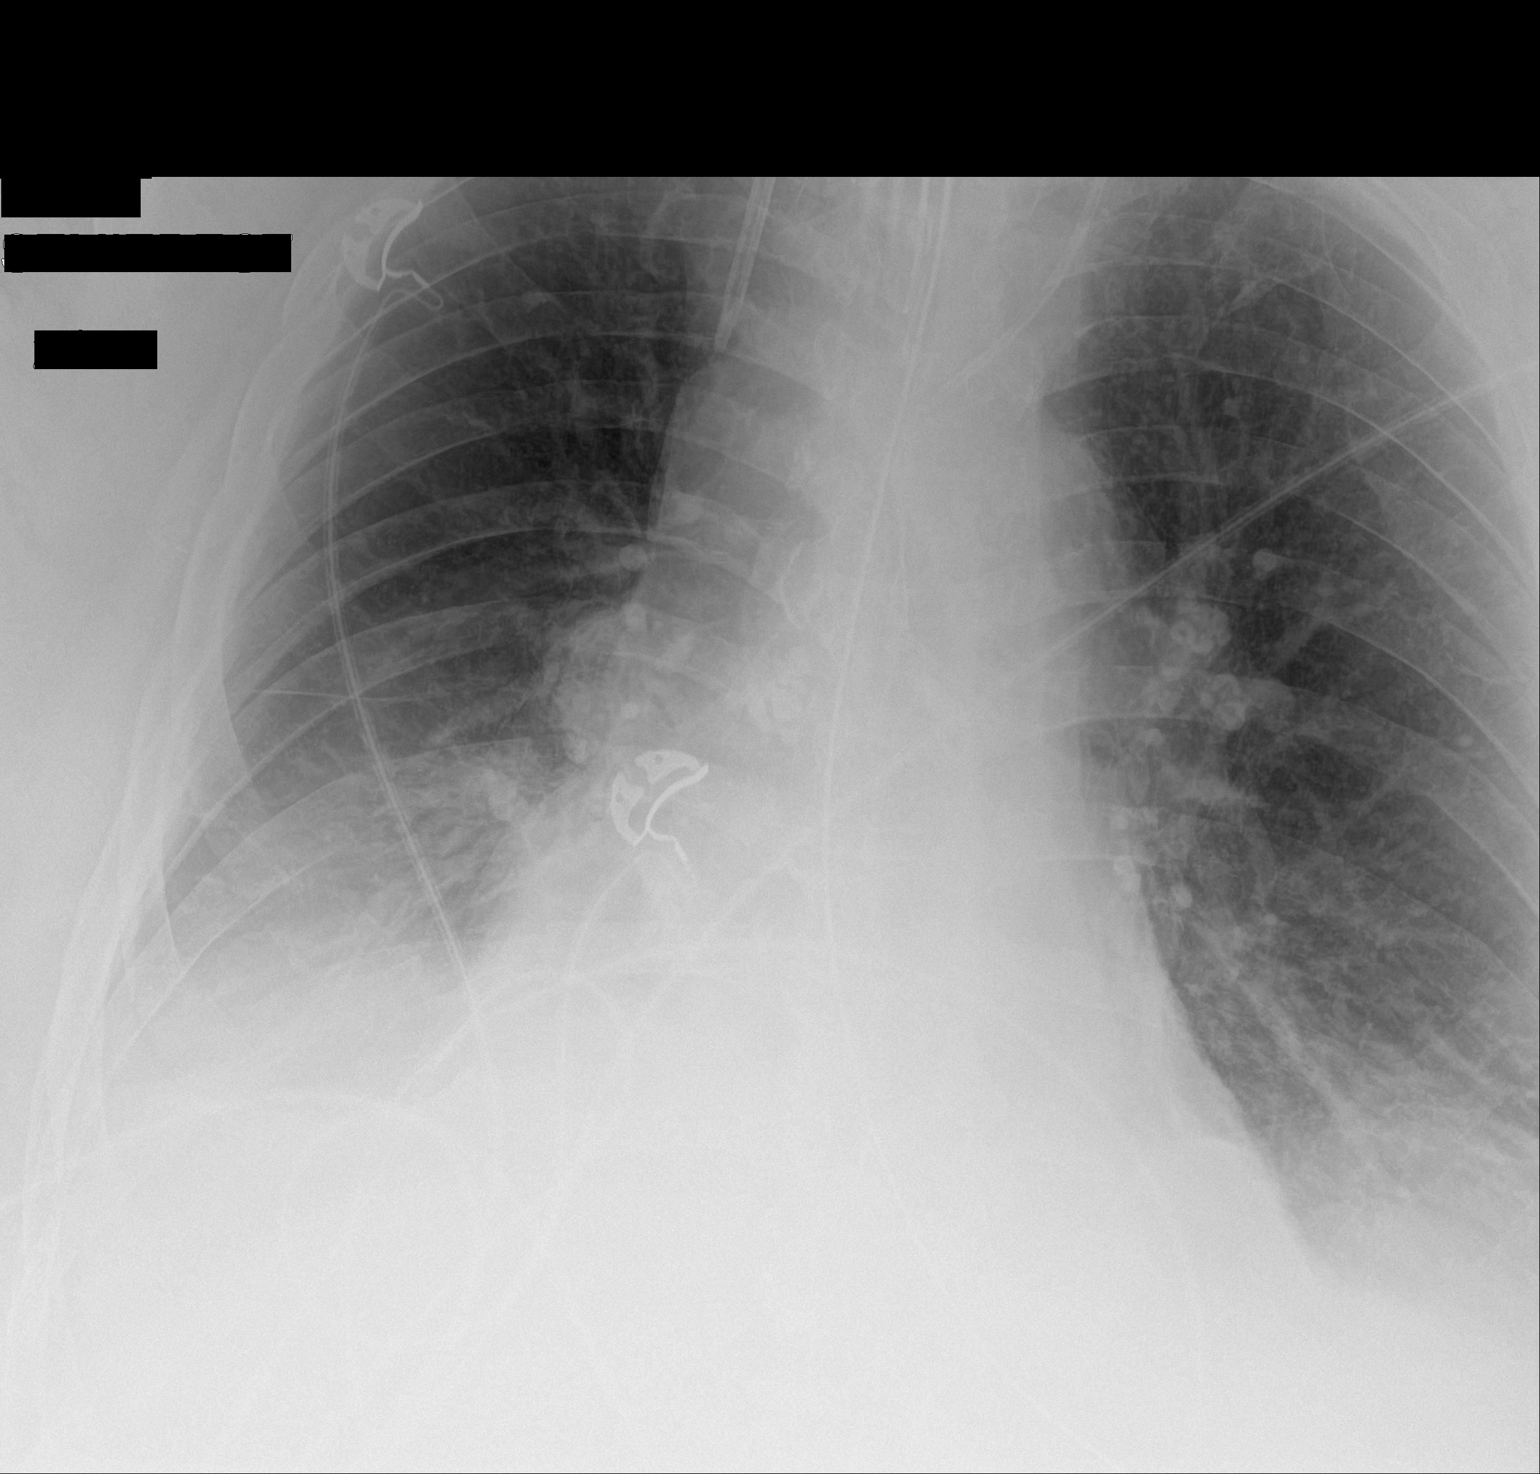

[2 of 2 positions shown; findings below may reference images not displayed]

FINDINGS: Stable endotracheal tube tip. Stable bilateral IJ approach vascular
catheters. Enteric tube courses to the abdomen, tip not included.

No pneumothorax. Pulmonary vascularity appears stable since
11/12/2016 without strong evidence of acute pulmonary edema. Veiling
bibasilar pulmonary opacity and confluent retrocardiac opacity.
Stable cardiac size and mediastinal contours.
IMPRESSION: 1.  Stable lines and tubes.
2. Small bilateral pleural effusions and bilateral lower lobe
collapse or consolidation.
3. No acute pulmonary edema suspected.

## 2017-11-18 IMAGING — CR DG CHEST 1V PORT
1 series · 1 of 1 positions shown · non-contrast
Comparison: 11/17/2016

CLINICAL DATA: PNEUMONIA

EXAM:
PORTABLE CHEST 1 VIEW

[AP]
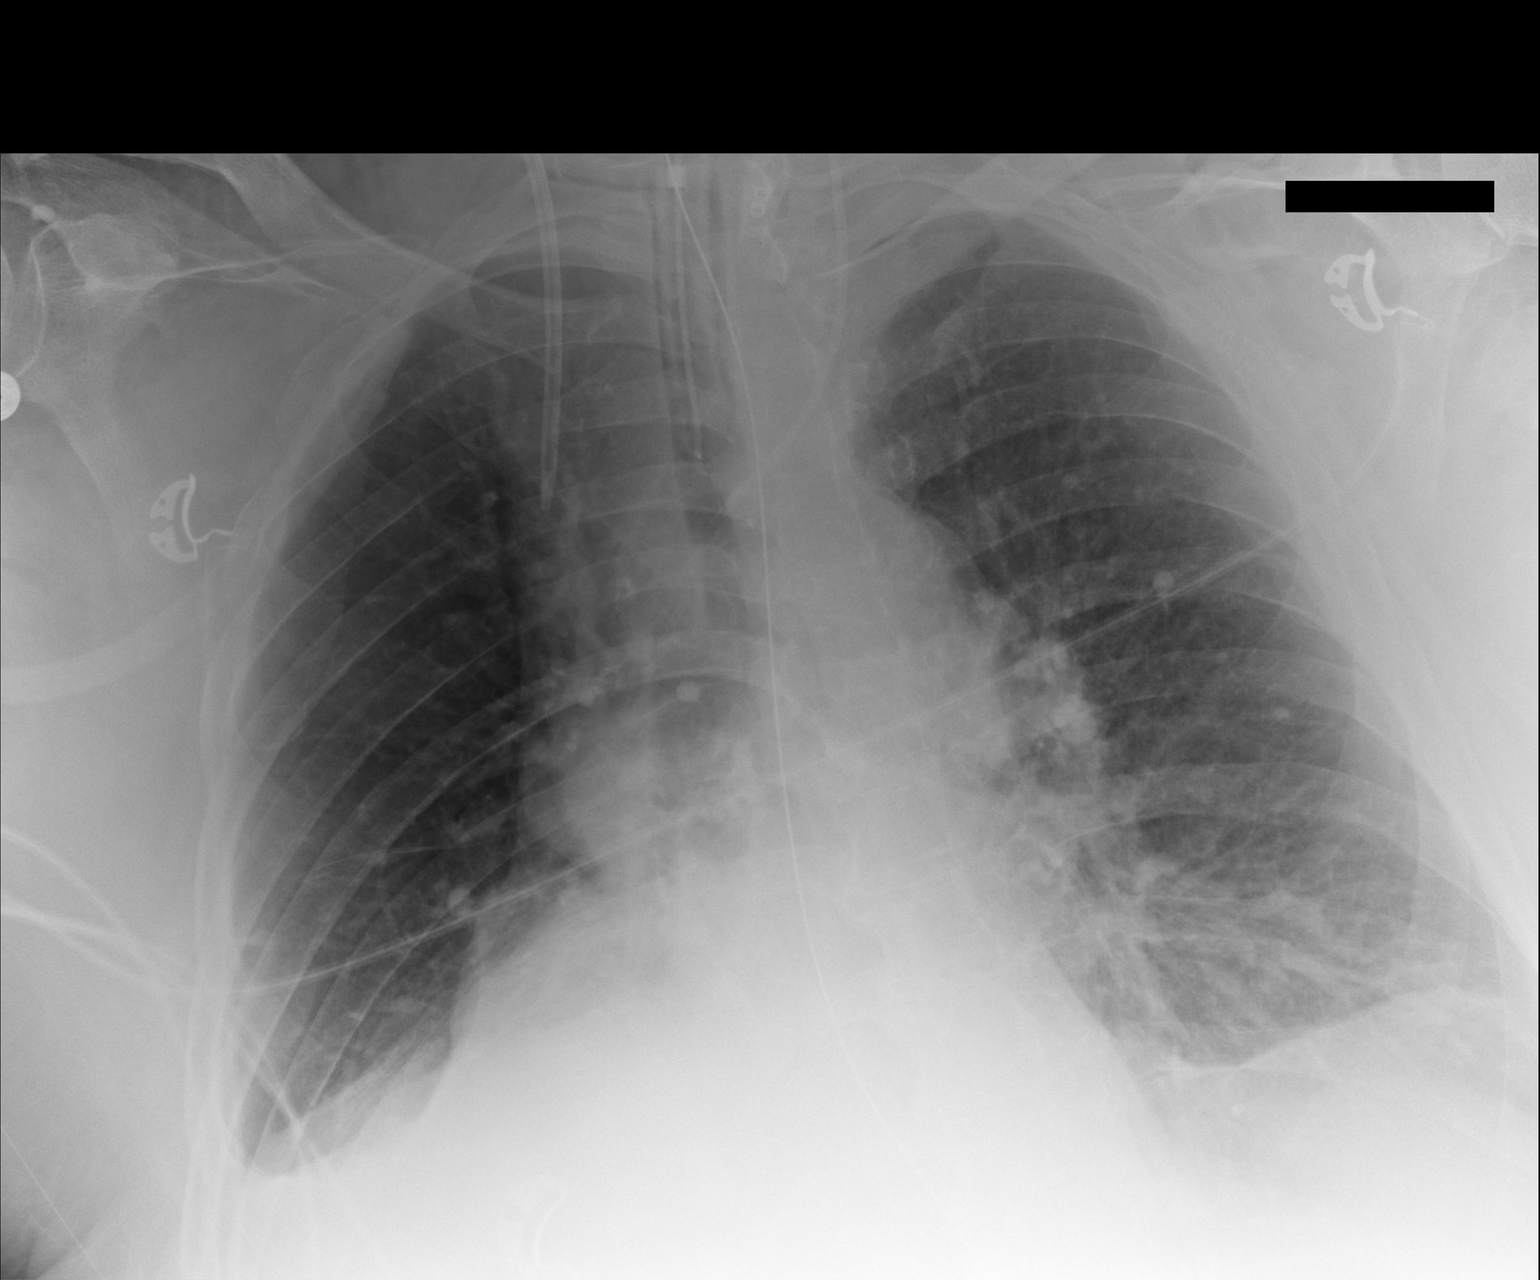

[1 of 1 positions shown; findings below may reference images not displayed]

FINDINGS: Support devices are stable. Small right pleural effusion. Bibasilar
atelectasis or infiltrates with improvement in aeration in the lung
bases since prior study.
IMPRESSION: Bibasilar atelectasis or infiltrates, improved since prior study.

Small right effusion.

## 2017-11-20 IMAGING — CR DG CHEST 1V PORT
1 series · 1 of 1 positions shown · non-contrast
Comparison: 11/18/2016

CLINICAL DATA: Respiratory failure

EXAM:
PORTABLE CHEST 1 VIEW

[AP]
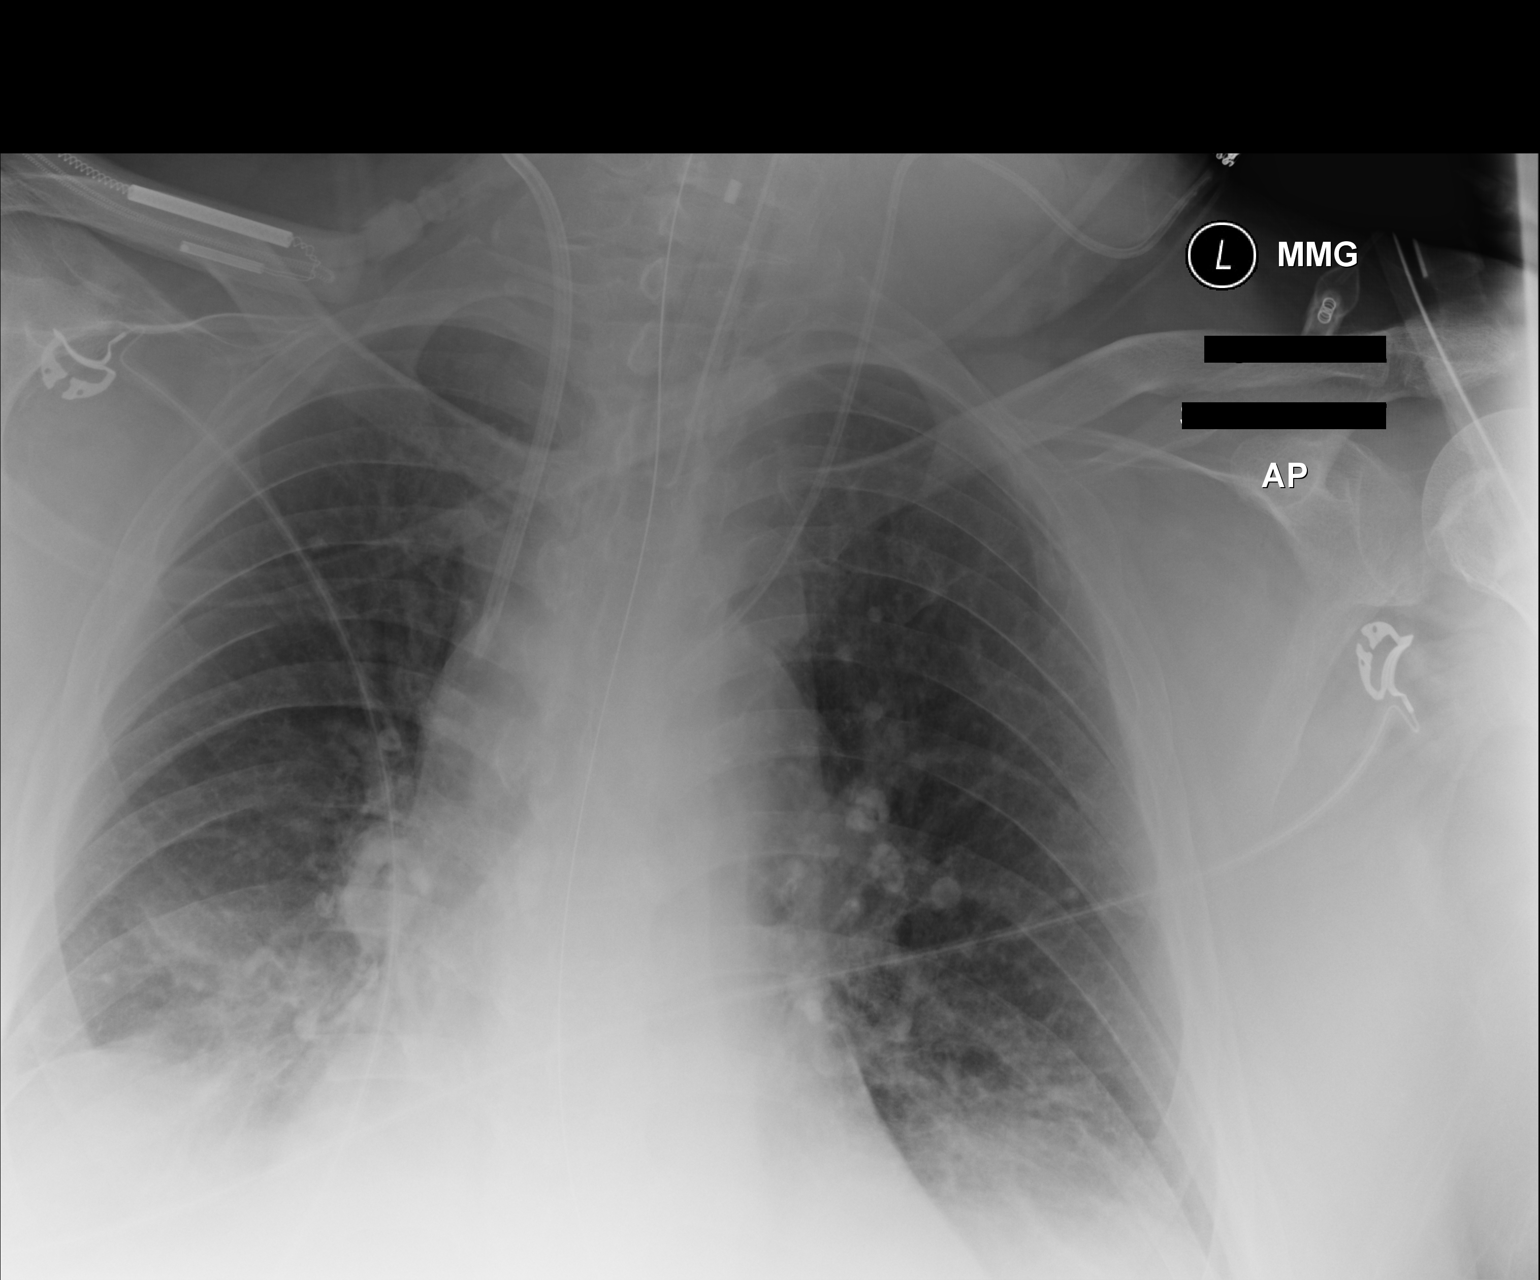

[1 of 1 positions shown; findings below may reference images not displayed]

FINDINGS: Endotracheal tube in good position. NG tube in the stomach.
Bilateral central venous catheter tips unchanged in the SVC and left
innominate vein. No pneumothorax

Progression of bibasilar airspace disease. No significant edema or
effusion.
IMPRESSION: Support lines remain in satisfactory position, unchanged

Progression of bibasilar airspace disease.
